# Patient Record
Sex: Male | Born: 1980 | Hispanic: No | Smoking: Current every day smoker
Health system: Southern US, Community
[De-identification: ages and names within clinical notes are randomized; demographics above are authoritative.]

## PROBLEM LIST (undated history)

## (undated) DIAGNOSIS — F1910 Other psychoactive substance abuse, uncomplicated: Secondary | ICD-10-CM

## (undated) DIAGNOSIS — B20 Human immunodeficiency virus [HIV] disease: Secondary | ICD-10-CM

## (undated) DIAGNOSIS — F319 Bipolar disorder, unspecified: Secondary | ICD-10-CM

## (undated) HISTORY — DX: Human immunodeficiency virus (HIV) disease: B20

---

## 2014-11-04 HISTORY — PX: ANKLE SURGERY: SHX546

## 2015-11-02 ENCOUNTER — Emergency Department (HOSPITAL_COMMUNITY)
Admission: EM | Admit: 2015-11-02 | Discharge: 2015-11-02 | Disposition: A | Discharge status: Home / Self Care | Payer: Medicare HMO | Attending: Emergency Medicine | Admitting: Emergency Medicine

## 2015-11-02 ENCOUNTER — Encounter (HOSPITAL_COMMUNITY): Payer: Self-pay | Admitting: Emergency Medicine

## 2015-11-02 DIAGNOSIS — F329 Major depressive disorder, single episode, unspecified: Secondary | ICD-10-CM | POA: Diagnosis present

## 2015-11-02 DIAGNOSIS — B20 Human immunodeficiency virus [HIV] disease: Secondary | ICD-10-CM | POA: Diagnosis not present

## 2015-11-02 DIAGNOSIS — F309 Manic episode, unspecified: Secondary | ICD-10-CM | POA: Diagnosis not present

## 2015-11-02 DIAGNOSIS — F1410 Cocaine abuse, uncomplicated: Secondary | ICD-10-CM | POA: Insufficient documentation

## 2015-11-02 DIAGNOSIS — F3010 Manic episode without psychotic symptoms, unspecified: Secondary | ICD-10-CM

## 2015-11-02 DIAGNOSIS — F17210 Nicotine dependence, cigarettes, uncomplicated: Secondary | ICD-10-CM | POA: Diagnosis not present

## 2015-11-02 DIAGNOSIS — F1310 Sedative, hypnotic or anxiolytic abuse, uncomplicated: Secondary | ICD-10-CM | POA: Diagnosis not present

## 2015-11-02 DIAGNOSIS — F1510 Other stimulant abuse, uncomplicated: Secondary | ICD-10-CM | POA: Diagnosis not present

## 2015-11-02 DIAGNOSIS — F1210 Cannabis abuse, uncomplicated: Secondary | ICD-10-CM | POA: Insufficient documentation

## 2015-11-02 DIAGNOSIS — Z79899 Other long term (current) drug therapy: Secondary | ICD-10-CM | POA: Insufficient documentation

## 2015-11-02 HISTORY — DX: Human immunodeficiency virus (HIV) disease: B20

## 2015-11-02 LAB — RAPID URINE DRUG SCREEN, HOSP PERFORMED
Amphetamines: POSITIVE — AB
BARBITURATES: NOT DETECTED
Benzodiazepines: POSITIVE — AB
Cocaine: POSITIVE — AB
OPIATES: NOT DETECTED
Tetrahydrocannabinol: POSITIVE — AB

## 2015-11-02 LAB — COMPREHENSIVE METABOLIC PANEL
ALBUMIN: 4 g/dL (ref 3.5–5.0)
ALT: 23 U/L (ref 17–63)
ANION GAP: 10 (ref 5–15)
AST: 24 U/L (ref 15–41)
Alkaline Phosphatase: 69 U/L (ref 38–126)
BUN: 11 mg/dL (ref 6–20)
CHLORIDE: 106 mmol/L (ref 101–111)
CO2: 26 mmol/L (ref 22–32)
Calcium: 8.8 mg/dL — ABNORMAL LOW (ref 8.9–10.3)
Creatinine, Ser: 0.88 mg/dL (ref 0.61–1.24)
GFR calc Af Amer: >60 mL/min (ref >60)
Glucose, Bld: 103 mg/dL — ABNORMAL HIGH (ref 65–99)
POTASSIUM: 4.1 mmol/L (ref 3.5–5.1)
Sodium: 142 mmol/L (ref 135–145)
Total Bilirubin: 0.3 mg/dL (ref 0.3–1.2)
Total Protein: 7.1 g/dL (ref 6.5–8.1)

## 2015-11-02 LAB — CBC
HCT: 45.5 % (ref 39.0–52.0)
Hemoglobin: 16.1 g/dL (ref 13.0–17.0)
MCH: 33.7 pg (ref 26.0–34.0)
MCHC: 35.4 g/dL (ref 30.0–36.0)
MCV: 95.2 fL (ref 78.0–100.0)
PLATELETS: 330 10*3/uL (ref 150–400)
RBC: 4.78 MIL/uL (ref 4.22–5.81)
RDW: 13.4 % (ref 11.5–15.5)
WBC: 7.6 10*3/uL (ref 4.0–10.5)

## 2015-11-02 LAB — SALICYLATE LEVEL: Salicylate Lvl: <4 mg/dL (ref 2.8–30.0)

## 2015-11-02 LAB — ETHANOL

## 2015-11-02 LAB — ACETAMINOPHEN LEVEL

## 2015-11-02 NOTE — ED Notes (Addendum)
Please disregard note. Error in entry.

## 2015-11-02 NOTE — ED Provider Notes (Signed)
CSN: [id number]     Arrival date & time 11/02/15  4098 History   First MD Initiated Contact with Patient 11/02/15 0740     Chief Complaint  Patient presents with  . Depression  . Mental Health Problem      HPI Patient reports episodes of depression, paranoia, angry outbursts, and what he believes to be mania over the past 2 weeks, worsening over the past week. Endorses thoughts of self harm but denies plan or intent, states that he is too cowardly to commit suicide and would never do it, states if he left hospital he would not be in danger of harming self. Denies HI/AVH. Describes "manic episodes" as periods of paranoia against his spouse, uncharacteristic angry outbursts where he broke lightbulbs in his house, and 2 episodes of unsafe behavior in which he drove excessively fast and continued accelerating the vehicle despite passengers fearfully asking him to stop. States this behavior is not at all consistent with his normal self. States he has been using cocaine frequently while partying with his friends and that his husband states pt's episodes correlate positively with cocaine use followed by not sleeping. No cocaine use today. Stressors include Mother and Grandmother dying in June 2016. Recent marriage. History of unspecified "behavior disorder" in 2006 which was resolved, cocaine and crystal meth use, HIV with CD4 levels below detectable range. Family history of bipolar disorder. Past Medical History  Diagnosis Date  . HIV (human immunodeficiency virus infection) [facility])    Past Surgical History  Procedure Laterality Date  . Ankle surgery  11/2014   History reviewed. No pertinent family history. Social History  Substance Use Topics  . Smoking status: Current Every Day Smoker -- 1.00 packs/day    Types: Cigarettes  . Smokeless tobacco: None  . Alcohol Use: Yes     Comment: occasionally    Review of Systems  All other systems reviewed and are negative.     Allergies  Review of  patient's allergies indicates no known allergies.  Home Medications   Prior to Admission medications   Medication Sig Start Date End Date Taking? Authorizing Provider  efavirenz-emtricitabine-tenofovir (ATRIPLA) 600-200-300 MG tablet Take 1 tablet by mouth at bedtime.   Yes Historical Provider, MD   BP 134/88 mmHg  Pulse 102  Temp(Src) 98.5 F (36.9 C) (Oral)  Resp 18  SpO2 100% Physical Exam  Constitutional: He is oriented to person, place, and time. He appears well-developed and well-nourished. No distress.  HENT:  Head: Normocephalic and atraumatic.  Eyes: Pupils are equal, round, and reactive to light.  Neck: Normal range of motion.  Cardiovascular: Normal rate and intact distal pulses.   Pulmonary/Chest: No respiratory distress.  Abdominal: Normal appearance. He exhibits no distension.  Musculoskeletal: Normal range of motion.  Neurological: He is alert and oriented to person, place, and time. No cranial nerve deficit.  Skin: Skin is warm and dry. No rash noted.  Psychiatric: He has a normal mood and affect. His behavior is normal. His speech is rapid and/or pressured. He expresses no homicidal and no suicidal ideation.  Nursing note and vitals reviewed.   ED Course  Procedures (including critical care time) Labs Review Labs Reviewed  COMPREHENSIVE METABOLIC PANEL - Abnormal; Notable for the following:    Glucose, Bld 103 (*)    Calcium 8.8 (*)    All other components within normal limits  ACETAMINOPHEN LEVEL - Abnormal; Notable for the following:    Acetaminophen (Tylenol), Serum <10 (*)    All  other components within normal limits  URINE RAPID DRUG SCREEN, HOSP PERFORMED - Abnormal; Notable for the following:    Cocaine POSITIVE (*)    Benzodiazepines POSITIVE (*)    Amphetamines POSITIVE (*)    Tetrahydrocannabinol POSITIVE (*)    All other components within normal limits  ETHANOL  SALICYLATE LEVEL  CBC    The patient was seen and evaluated by TTS.  Patient  was offered psychiatric counseling.  Did not want to be admitted and stated that he would find his own psychiatrist.  Patient is not acutely psychotic or suicidal.  At this time patient does not meet criteria for involuntary commitment.  We'll discharge with follow-up.    MDM   Final diagnoses:  Manic behavior (HCC)        Nelva Nayobert Danie Hannig, MD 11/02/15 1023

## 2015-11-02 NOTE — ED Notes (Signed)
Patient not in department when RN searched for pt for discharge. No discharge vitals or discharge signature obtained due to this. Pt had been screened and discharged.

## 2015-11-02 NOTE — ED Notes (Signed)
Patient reports episodes of depression, paranoia, angry outbursts, and what he believes to be mania over the past 2 weeks, worsening over the past week. Endorses thoughts of self harm but denies plan or intent, states that he is too cowardly to commit suicide and would never do it, states if he left hospital he would not be in danger of harming self. Denies HI/AVH. Describes "manic episodes" as periods of paranoia against his spouse, uncharacteristic angry outbursts where he broke lightbulbs in his house, and 2 episodes of unsafe behavior in which he drove excessively fast and continued accelerating the vehicle despite passengers fearfully asking him to stop. States this behavior is not at all consistent with his normal self. States he has been using cocaine frequently while partying with his friends and that his husband states pt's episodes correlate positively with cocaine use followed by not sleeping. No cocaine use today. Stressors include Mother and Grandmother dying in June 2016. Recent marriage. History of unspecified "behavior disorder" in 2006 which was resolved, cocaine and crystal meth use, HIV with CD4 levels below detectable range. Family history of bipolar disorder.

## 2015-11-02 NOTE — BH Assessment (Addendum)
Assessment Note  Jeffrey Mora is an 34 y.o.  male [facility] someone about his self diagnosed Bipolar Disorder. Sts that he took a "Bipolar Quiz" on the Internet that told him he was Bipolar. Patient reports recently feeling overwhelmed, paranoid, depressed, and suicidal without plan/intent. Onset of symptoms started after the death of his grandmother 5-6 months ago. Although, patient reports suicidal thoughts "on/off" he is able to contract for safety today. Sts, "I would never harm myself". He has no history of suicidal attempts/gestures. No self mutilating behaviors. Patient denies HI. He is currently cooperative. However, patient does appear to have manic symptoms evidenced by pressured speech and flight of ideas. No AVH's. However, patient reports paranoid thoughts occasionally. He reports being intimate with his spouse. Sts that the phone was near them during the intimate moment. Patient sts, "I couldn't stop thinking that I was being recorded". Patient also speaks of driving fast due to increased irritation. Sts that he is easily angered and just last week threw a object at the wall. Patient also finds himself crying uncontrollably. He has a family history of Bipolar Disorder (sister). No history of abuse.   Patient reports drug use. See Additional Social History section for details related to patient's substance use.   Patient has no history of inpatient treatment. He also has no history of outpatient services with the exception of a 1x session of grief therapy. Sts that the therapy was for the death of his grandmother. Patient is not taking any psychotropic medications.   Diagnosis: Bipolar, Manic Episode  Past Medical History:  Past Medical History  Diagnosis Date  . [facility]     Past Surgical History  Procedure Laterality Date  . Ankle surgery  11/2014    Family History: History reviewed. No pertinent family history.  Social  History:  reports that he has been smoking Cigarettes.  He has been smoking about 1.00 pack per day. He does not have any smokeless tobacco history on file. He reports that he drinks alcohol. He reports that he uses illicit drugs (Cocaine).  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" 1 - Age of First Use: 34 yrs old  1 - Amount (size/oz): up to 3 grams per use 1 - Frequency: unk 1 - Duration: unk 1 - Last Use / Amount: October 2016/ 3grams per week  Substance #2 Name of Substance 2: Cocaine 2 - Age of First Use: 34 yrs old  2 - Amount (size/oz): 2-7 grams per episode 2 - Frequency: 2x/month  2 - Duration: on-going  2 - Last Use / Amount: Christmas Day 2016; 2-3 grams  Substance #3 Name of Substance 3: THC 3 - Age of First Use: 14-15 yrs old  3 - Amount (size/oz): "I use very lightly" 3 - Frequency: "rarely use" 3 - Duration: on-going  3 - Last Use / Amount: "This morning"; "I took 1 puff"  CIWA: CIWA-Ar BP: 134/88 mmHg Pulse Rate: 102 COWS:    Allergies: No Known Allergies  Home Medications:  (Not in a hospital admission)  OB/GYN Status:  No LMP for male patient.  General Assessment Data Location of Assessment: [facility] ED TTS Assessment: In system Is this a Tele or Face-to-Face Assessment?: Face-to-Face Is this an Initial Assessment or a Re-assessment for this encounter?: Initial Assessment Marital status: Married  Maiden name:  (Married ) Is patient pregnant?: No Pregnancy Status: No Living Arrangements: Other (Comment), Spouse/significant other Can pt return to current living arrangement?: Yes Admission Status: Voluntary Is patient capable of signing voluntary admission?: Yes Referral Source: Self/Family/Friend Insurance type:  Administrator Medicare)     Crisis Care Plan Living Arrangements: Other (Comment), Spouse/significant  other Legal Guardian:  (patient denies ) Name of Psychiatrist:  (No psychiatrist ) Name of Therapist:  (No therapist )     Risk to self with the past 6 months Is patient at risk for suicide?: No, but patient needs Medical Clearance Family Suicide History:  (Mother-Bipolar ) Recent stressful life event(s): Other (Comment) ("I think I'm Bipolar..took a online test...it said I'm Bipol) Depression: Yes Depression Symptoms: Tearfulness Substance abuse history and/or treatment for substance abuse?: No Suicide prevention information given to non-admitted patients: Not applicable  Risk to Others within the past 6 months Violent Behavior Description:  (Patient currently calm and cooperative )  Psychosis Delusions: Unspecified ("I have delusional paranoid thoughts in my head")        ADLScreening [facility] Assessment Services) Patient's cognitive ability adequate to safely complete daily activities?: Yes Patient able to express need for assistance with ADLs?: Yes Independently performs ADLs?: Yes (appropriate for developmental age)        ADL Screening (condition at time of admission) Patient's cognitive ability adequate to safely complete daily activities?: Yes Is the patient deaf or have difficulty hearing?: No Does the patient have difficulty seeing, even when wearing glasses/contacts?: No Does the patient have difficulty concentrating, remembering, or making decisions?: No Patient able to express need for assistance with ADLs?: Yes Does the patient have difficulty dressing or bathing?: No Independently performs ADLs?: Yes (appropriate for developmental age) Does the patient have difficulty walking or climbing stairs?: No Weakness of Legs: None Weakness of Arms/Hands: None  Home Assistive Devices/Equipment Home Assistive Devices/Equipment: None    Abuse/Neglect Assessment (Assessment to be complete while patient is alone) Physical Abuse: Denies Verbal Abuse: Denies Sexual  Abuse: Denies Exploitation of patient/patient's resources: Denies Self-Neglect: Denies Values / Beliefs Cultural Requests During Hospitalization: None Spiritual Requests During Hospitalization: None   Advance Directives (For Healthcare) Does patient have an advance directive?: No Would patient like information on creating an advanced directive?: No - patient declined information          Disposition:   Patient offered but refused the Observation unit. Patient also offered but refused Psych IOP. Patient stated that he would like to find a psychiatrist/therapist on his own. Writer suggested the [employer] counseling center. Patient is a Consulting civil engineer at [employer]. Patient refused this suggestion. Writer also encouraged patient to follow up with his insurance provider for a list of providers (therapist/psychiatrist) that could bill under his insurance. Patient did agree to do so.   Discussed clinicals with Dr. Radford Pax and he agreed to discharge patient home for him to follow up a therapist or psychiatrist. Patient is no committable and would like to go home asap.     On Site Evaluation by:   Reviewed with Physician:    Melynda Ripple [facility] 11/02/2015 10:30 AM

## 2015-11-02 NOTE — Discharge Instructions (Signed)
Bipolar Disorder °Bipolar disorder is a mental illness. The term bipolar disorder actually is used to describe a group of disorders that all share varying degrees of emotional highs and lows that can interfere with daily functioning, such as work, school, or relationships. Bipolar disorder also can lead to drug abuse, hospitalization, and suicide. °The emotional highs of bipolar disorder are periods of elation or irritability and high energy. These highs can range from a mild form (hypomania) to a severe form (mania). People experiencing episodes of hypomania may appear energetic, excitable, and highly productive. People experiencing mania may behave impulsively or erratically. They often make poor decisions. They may have difficulty sleeping. The most severe episodes of mania can involve having very distorted beliefs or perceptions about the world and seeing or hearing things that are not real (psychotic delusions and hallucinations).  °The emotional lows of bipolar disorder (depression) also can range from mild to severe. Severe episodes of bipolar depression can involve psychotic delusions and hallucinations. °Sometimes people with bipolar disorder experience a state of mixed mood. Symptoms of hypomania or mania and depression are both present during this mixed-mood episode. °SIGNS AND SYMPTOMS °There are signs and symptoms of the episodes of hypomania and mania as well as the episodes of depression. The signs and symptoms of hypomania and mania are similar but vary in severity. They include: °· Inflated self-esteem or feeling of increased self-confidence. °· Decreased need for sleep. °· Unusual talkativeness (rapid or pressured speech) or the feeling of a need to keep talking. °· Sensation of racing thoughts or constant talking, with quick shifts between topics that may or may not be related (flight of ideas). °· Decreased ability to focus or concentrate. °· Increased purposeful activity, such as work, studies,  or social activity, or nonproductive activity, such as pacing, squirming and fidgeting, or finger and toe tapping. °· Impulsive behavior and use of poor judgment, resulting in high-risk activities, such as having unprotected sex or spending excessive amounts of money. °Signs and symptoms of depression include the following:  °· Feelings of sadness, hopelessness, or helplessness. °· Frequent or uncontrollable episodes of crying. °· Lack of feeling anything or caring about anything. °· Difficulty sleeping or sleeping too much.  °· Inability to enjoy the things you used to enjoy.   °· Desire to be alone all the time.   °· Feelings of guilt or worthlessness.  °· Lack of energy or motivation.   °· Difficulty concentrating, remembering, or making decisions.  °· Change in appetite or weight beyond normal fluctuations. °· Thoughts of death or the desire to harm yourself. °DIAGNOSIS  °Bipolar disorder is diagnosed through an assessment by your caregiver. Your caregiver will ask questions about your emotional episodes. There are two main types of bipolar disorder. People with type I bipolar disorder have manic episodes with or without depressive episodes. People with type II bipolar disorder have hypomanic episodes and major depressive episodes, which are more serious than mild depression. The type of bipolar disorder you have can make an important difference in how your illness is monitored and treated. °Your caregiver may ask questions about your medical history and use of alcohol or drugs, including prescription medication. Certain medical conditions and substances also can cause emotional highs and lows that resemble bipolar disorder (secondary bipolar disorder).  °TREATMENT  °Bipolar disorder is a long-term illness. It is best controlled with continuous treatment rather than treatment only when symptoms occur. The following treatments can be prescribed for bipolar disorders: °· Medication--Medication can be prescribed by  a doctor that   is an expert in treating mental disorders (psychiatrists). Medications called mood stabilizers are usually prescribed to help control the illness. Other medications are sometimes added if symptoms of mania, depression, or psychotic delusions and hallucinations occur despite the use of a mood stabilizer. °· Talk therapy--Some forms of talk therapy are helpful in providing support, education, and guidance. °A combination of medication and talk therapy is best for managing the disorder over time. A procedure in which electricity is applied to your brain through your scalp (electroconvulsive therapy) is used in cases of severe mania when medication and talk therapy do not work or work too slowly. °  °This information is not intended to replace advice given to you by your health care provider. Make sure you discuss any questions you have with your health care provider. °  °Document Released: 01/27/2001 Document Revised: 11/11/2014 Document Reviewed: 11/16/2012 °Elsevier Interactive Patient Education ©2016 [employer] ° °

## 2015-11-04 ENCOUNTER — Emergency Department (HOSPITAL_COMMUNITY)
Admission: EM | Admit: 2015-11-04 | Discharge: 2015-11-05 | Disposition: A | Discharge status: Other Acute Inpatient Hospital | Payer: Medicare HMO | Attending: Emergency Medicine | Admitting: Emergency Medicine

## 2015-11-04 ENCOUNTER — Encounter (HOSPITAL_COMMUNITY): Payer: Self-pay | Admitting: Emergency Medicine

## 2015-11-04 DIAGNOSIS — F17210 Nicotine dependence, cigarettes, uncomplicated: Secondary | ICD-10-CM | POA: Insufficient documentation

## 2015-11-04 DIAGNOSIS — F1310 Sedative, hypnotic or anxiolytic abuse, uncomplicated: Secondary | ICD-10-CM | POA: Insufficient documentation

## 2015-11-04 DIAGNOSIS — Y998 Other external cause status: Secondary | ICD-10-CM | POA: Insufficient documentation

## 2015-11-04 DIAGNOSIS — Z79899 Other long term (current) drug therapy: Secondary | ICD-10-CM | POA: Insufficient documentation

## 2015-11-04 DIAGNOSIS — Y9289 Other specified places as the place of occurrence of the external cause: Secondary | ICD-10-CM | POA: Insufficient documentation

## 2015-11-04 DIAGNOSIS — T424X2A Poisoning by benzodiazepines, intentional self-harm, initial encounter: Secondary | ICD-10-CM | POA: Insufficient documentation

## 2015-11-04 DIAGNOSIS — F1210 Cannabis abuse, uncomplicated: Secondary | ICD-10-CM | POA: Insufficient documentation

## 2015-11-04 DIAGNOSIS — T50902A Poisoning by unspecified drugs, medicaments and biological substances, intentional self-harm, initial encounter: Secondary | ICD-10-CM

## 2015-11-04 DIAGNOSIS — F332 Major depressive disorder, recurrent severe without psychotic features: Secondary | ICD-10-CM

## 2015-11-04 DIAGNOSIS — B20 Human immunodeficiency virus [HIV] disease: Secondary | ICD-10-CM | POA: Insufficient documentation

## 2015-11-04 DIAGNOSIS — Y9389 Activity, other specified: Secondary | ICD-10-CM | POA: Insufficient documentation

## 2015-11-04 LAB — CBC
HCT: 46.8 % (ref 39.0–52.0)
Hemoglobin: 16.6 g/dL (ref 13.0–17.0)
MCH: 33.7 pg (ref 26.0–34.0)
MCHC: 35.5 g/dL (ref 30.0–36.0)
MCV: 95.1 fL (ref 78.0–100.0)
PLATELETS: 326 10*3/uL (ref 150–400)
RBC: 4.92 MIL/uL (ref 4.22–5.81)
RDW: 13.5 % (ref 11.5–15.5)
WBC: 12.6 10*3/uL — AB (ref 4.0–10.5)

## 2015-11-04 LAB — COMPREHENSIVE METABOLIC PANEL
ALT: 22 U/L (ref 17–63)
AST: 27 U/L (ref 15–41)
Albumin: 4.2 g/dL (ref 3.5–5.0)
Alkaline Phosphatase: 74 U/L (ref 38–126)
Anion gap: 9 (ref 5–15)
BUN: 14 mg/dL (ref 6–20)
CHLORIDE: 108 mmol/L (ref 101–111)
CO2: 23 mmol/L (ref 22–32)
Calcium: 9.2 mg/dL (ref 8.9–10.3)
Creatinine, Ser: 0.79 mg/dL (ref 0.61–1.24)
Glucose, Bld: 104 mg/dL — ABNORMAL HIGH (ref 65–99)
POTASSIUM: 4.3 mmol/L (ref 3.5–5.1)
SODIUM: 140 mmol/L (ref 135–145)
Total Bilirubin: 0.5 mg/dL (ref 0.3–1.2)
Total Protein: 7.2 g/dL (ref 6.5–8.1)

## 2015-11-04 LAB — ETHANOL

## 2015-11-04 LAB — SALICYLATE LEVEL

## 2015-11-04 LAB — ACETAMINOPHEN LEVEL

## 2015-11-04 MED ORDER — SODIUM CHLORIDE 0.9 % IV BOLUS (SEPSIS)
2000.0000 mL | Freq: Once | INTRAVENOUS | Status: AC
  Administered 2015-11-04: 2000 mL via INTRAVENOUS

## 2015-11-04 NOTE — ED Provider Notes (Signed)
CSN: [id number]     Arrival date & time 11/04/15  2258 History  By signing my name below, I, Phillis HaggisGabriella Gaje, attest that this documentation has been prepared under the direction and in the presence of Tanzie Rothschild, MD. Electronically Signed: Phillis HaggisGabriella Gaje, ED Scribe. 11/04/2015. 12:54 AM.  Chief Complaint  Patient presents with  . Drug Overdose   Patient is a 34 y.o. male presenting with Overdose. The history is provided by the patient. No language interpreter was used.  Drug Overdose This is a new problem. The current episode started less than 1 hour ago. The problem occurs constantly. The problem has been gradually worsening. He has tried nothing for the symptoms.  HPI Comments: Kenard Gowerony Rodino is a 34 y.o. Male with a hx of HIV who presents to the Emergency Department complaining of drug overdose onset one hour ago. Pt reports that he used marijuana earlier today and took about 30 mg of Xanax, ~22 mg in the last hour. Pt states that he was trying to "knock myself out so my husband could go party." He reports that "I wasn't necessarily trying to commit suicide." Pt reports drowsiness and fatigue. Per triage note, pt is trying to detox from cocaine which he last used one week ago. Pt was seen in the ED 11/02/15 for a suicide attempt. He denies fever, chills, nausea, vomiting, hallucinations or HI.     Past Medical History  Diagnosis Date  . HIV (human immunodeficiency virus infection) [facility](HCC)    Past Surgical History  Procedure Laterality Date  . Ankle surgery  11/2014   History reviewed. No pertinent family history. Social History  Substance Use Topics  . Smoking status: Current Every Day Smoker -- 1.00 packs/day    Types: Cigarettes  . Smokeless tobacco: None  . Alcohol Use: Yes     Comment: occasionally    Review of Systems  Constitutional: Positive for fatigue. Negative for fever and chills.  Gastrointestinal: Negative for nausea and vomiting.  Psychiatric/Behavioral: Positive for  suicidal ideas. Negative for hallucinations.  All other systems reviewed and are negative.  Allergies  Review of patient's allergies indicates no known allergies.  Home Medications   Prior to Admission medications   Medication Sig Start Date End Date Taking? Authorizing Provider  efavirenz-emtricitabine-tenofovir (ATRIPLA) 600-200-300 MG tablet Take 1 tablet by mouth at bedtime.    Historical Provider, MD   BP 127/80 mmHg  Pulse 114  Temp(Src) 98.2 F (36.8 C) (Oral)  Resp 24  SpO2 95% Physical Exam  Constitutional: He is oriented to person, place, and time. He appears well-developed and well-nourished. No distress.  Smelled of marijuana  HENT:  Head: Normocephalic and atraumatic.  Mouth/Throat: Oropharynx is clear and moist. No oropharyngeal exudate.  Trachea midline  Eyes: Conjunctivae and EOM are normal. Pupils are equal, round, and reactive to light.  Neck: Trachea normal and normal range of motion. Neck supple. No JVD present. Carotid bruit is not present.  Cardiovascular: Normal rate and regular rhythm.  Exam reveals no gallop and no friction rub.   No murmur heard. Pulmonary/Chest: Effort normal and breath sounds normal. No stridor. He has no wheezes. He has no rales.  Abdominal: Soft. Bowel sounds are normal. He exhibits no mass. There is no tenderness. There is no rebound and no guarding.  Musculoskeletal: Normal range of motion.  Lymphadenopathy:    He has no cervical adenopathy.  Neurological: He is alert and oriented to person, place, and time. He has normal reflexes. He displays normal  reflexes. No cranial nerve deficit. He exhibits normal muscle tone. Coordination normal.  Cranial nerves 2-12 intact  Skin: Skin is warm and dry. He is not diaphoretic.  Psychiatric: His behavior is normal. His affect is blunt. He expresses suicidal ideation.    ED Course  Procedures (including critical care time) DIAGNOSTIC STUDIES: Oxygen Saturation is 95% on RA, adequate by my  interpretation.    COORDINATION OF CARE: 11:17 PM-Discussed treatment plan which includes labs, IV and poison control call with pt at bedside and pt agreed to plan.    Labs Review Labs Reviewed  COMPREHENSIVE METABOLIC PANEL - Abnormal; Notable for the following:    Glucose, Bld 104 (*)    All other components within normal limits  ACETAMINOPHEN LEVEL - Abnormal; Notable for the following:    Acetaminophen (Tylenol), Serum <10 (*)    All other components within normal limits  CBC - Abnormal; Notable for the following:    WBC 12.6 (*)    All other components within normal limits  URINE RAPID DRUG SCREEN, HOSP PERFORMED - Abnormal; Notable for the following:    Benzodiazepines POSITIVE (*)    Tetrahydrocannabinol POSITIVE (*)    All other components within normal limits  I-STAT CHEM 8, ED - Abnormal; Notable for the following:    Glucose, Bld 108 (*)    Calcium, Ion 1.11 (*)    All other components within normal limits  ETHANOL  SALICYLATE LEVEL  CBG MONITORING, ED  I-STAT CG4 LACTIC ACID, ED  I-STAT CG4 LACTIC ACID, ED    Imaging Review No results found. I have personally reviewed and evaluated these images and lab results as part of my medical decision-making.   EKG Interpretation   Date/Time:  Saturday November 04 2015 23:13:54 EST Ventricular Rate:  112 PR Interval:  182 QRS Duration: 103 QT Interval:  331 QTC Calculation: 452 R Axis:   74 Text Interpretation:  Sinus tachycardia Confirmed by [facility]  MD,  Morene Antu ([id number]) on 11/04/2015 11:30:53 PM      MDM   Final diagnoses:  None    Will need admission to psych for suicide attempt with benzos  I personally performed the services described in this documentation, which was scribed in my presence. The recorded information has been reviewed and is accurate.      Cy Blamer, MD 11/05/15 [phone]

## 2015-11-04 NOTE — ED Notes (Signed)
Pt states that he has been having fights with his husband and took eight 2mg  Xanax bars approx. 45 mins ago. When he asked if he was trying to kill himself or sleep he stated "whatever happens happens." Feels drowsy. States he's trying to get off of cocaine. Last used 1 week ago. Alert and oriented.

## 2015-11-05 ENCOUNTER — Encounter (HOSPITAL_COMMUNITY): Payer: Self-pay

## 2015-11-05 ENCOUNTER — Inpatient Hospital Stay (HOSPITAL_COMMUNITY)
Admission: AD | Admit: 2015-11-05 | Discharge: 2015-11-08 | DRG: 885 | Disposition: A | Discharge status: Home / Self Care | Payer: Medicare HMO | Source: Intra-hospital | Attending: Psychiatry | Admitting: Psychiatry

## 2015-11-05 ENCOUNTER — Encounter (HOSPITAL_COMMUNITY): Payer: Self-pay | Admitting: Emergency Medicine

## 2015-11-05 DIAGNOSIS — T1491 Suicide attempt: Secondary | ICD-10-CM

## 2015-11-05 DIAGNOSIS — F17210 Nicotine dependence, cigarettes, uncomplicated: Secondary | ICD-10-CM | POA: Diagnosis present

## 2015-11-05 DIAGNOSIS — R45851 Suicidal ideations: Secondary | ICD-10-CM | POA: Diagnosis present

## 2015-11-05 DIAGNOSIS — F3181 Bipolar II disorder: Principal | ICD-10-CM | POA: Diagnosis present

## 2015-11-05 DIAGNOSIS — B20 Human immunodeficiency virus [HIV] disease: Secondary | ICD-10-CM | POA: Diagnosis present

## 2015-11-05 DIAGNOSIS — T424X2A Poisoning by benzodiazepines, intentional self-harm, initial encounter: Secondary | ICD-10-CM | POA: Diagnosis not present

## 2015-11-05 DIAGNOSIS — F1910 Other psychoactive substance abuse, uncomplicated: Secondary | ICD-10-CM | POA: Diagnosis present

## 2015-11-05 DIAGNOSIS — T50902A Poisoning by unspecified drugs, medicaments and biological substances, intentional self-harm, initial encounter: Secondary | ICD-10-CM | POA: Insufficient documentation

## 2015-11-05 DIAGNOSIS — F319 Bipolar disorder, unspecified: Secondary | ICD-10-CM | POA: Diagnosis present

## 2015-11-05 DIAGNOSIS — Z915 Personal history of self-harm: Secondary | ICD-10-CM | POA: Diagnosis not present

## 2015-11-05 DIAGNOSIS — G4700 Insomnia, unspecified: Secondary | ICD-10-CM | POA: Diagnosis present

## 2015-11-05 DIAGNOSIS — F332 Major depressive disorder, recurrent severe without psychotic features: Secondary | ICD-10-CM | POA: Diagnosis not present

## 2015-11-05 DIAGNOSIS — F1410 Cocaine abuse, uncomplicated: Secondary | ICD-10-CM | POA: Diagnosis present

## 2015-11-05 DIAGNOSIS — Z818 Family history of other mental and behavioral disorders: Secondary | ICD-10-CM | POA: Diagnosis not present

## 2015-11-05 LAB — RAPID URINE DRUG SCREEN, HOSP PERFORMED
Amphetamines: NOT DETECTED
BARBITURATES: NOT DETECTED
BENZODIAZEPINES: POSITIVE — AB
COCAINE: NOT DETECTED
Opiates: NOT DETECTED
TETRAHYDROCANNABINOL: POSITIVE — AB

## 2015-11-05 LAB — I-STAT CG4 LACTIC ACID, ED
LACTIC ACID, VENOUS: 0.68 mmol/L (ref 0.5–2.0)
Lactic Acid, Venous: 1.52 mmol/L (ref 0.5–2.0)

## 2015-11-05 LAB — I-STAT CHEM 8, ED
BUN: 16 mg/dL (ref 6–20)
CREATININE: 0.7 mg/dL (ref 0.61–1.24)
Calcium, Ion: 1.11 mmol/L — ABNORMAL LOW (ref 1.12–1.23)
Chloride: 111 mmol/L (ref 101–111)
GLUCOSE: 108 mg/dL — AB (ref 65–99)
HCT: 43 % (ref 39.0–52.0)
HEMOGLOBIN: 14.6 g/dL (ref 13.0–17.0)
Potassium: 4.2 mmol/L (ref 3.5–5.1)
Sodium: 144 mmol/L (ref 135–145)
TCO2: 25 mmol/L (ref 0–100)

## 2015-11-05 LAB — CBG MONITORING, ED: Glucose-Capillary: 99 mg/dL (ref 65–99)

## 2015-11-05 MED ORDER — ZIPRASIDONE MESYLATE 20 MG IM SOLR
20.0000 mg | Freq: Once | INTRAMUSCULAR | Status: AC
  Administered 2015-11-05: 20 mg via INTRAMUSCULAR

## 2015-11-05 MED ORDER — DIPHENHYDRAMINE HCL 50 MG/ML IJ SOLN
50.0000 mg | Freq: Once | INTRAMUSCULAR | Status: AC
  Administered 2015-11-05: 50 mg via INTRAMUSCULAR
  Filled 2015-11-05: qty 1

## 2015-11-05 MED ORDER — EFAVIRENZ-EMTRICITAB-TENOFOVIR 600-200-300 MG PO TABS
1.0000 | ORAL_TABLET | Freq: Every day | ORAL | Status: DC
  Administered 2015-11-06 – 2015-11-07 (×2): 1 via ORAL
  Filled 2015-11-05 (×4): qty 1

## 2015-11-05 MED ORDER — ALUM & MAG HYDROXIDE-SIMETH 200-200-20 MG/5ML PO SUSP: 30.0000 mL | ORAL | Status: DC | PRN

## 2015-11-05 MED ORDER — MAGNESIUM HYDROXIDE 400 MG/5ML PO SUSP: 30.0000 mL | Freq: Every day | ORAL | Status: DC | PRN

## 2015-11-05 MED ORDER — IBUPROFEN 200 MG PO TABS: 600.0000 mg | ORAL_TABLET | Freq: Three times a day (TID) | ORAL | Status: DC | PRN

## 2015-11-05 MED ORDER — ACETAMINOPHEN 325 MG PO TABS: 650.0000 mg | ORAL_TABLET | ORAL | Status: DC | PRN

## 2015-11-05 MED ORDER — NICOTINE 21 MG/24HR TD PT24
21.0000 mg | MEDICATED_PATCH | Freq: Once | TRANSDERMAL | Status: AC
  Administered 2015-11-05 (×2): 21 mg via TRANSDERMAL
  Filled 2015-11-05 (×2): qty 1

## 2015-11-05 MED ORDER — HYDROXYZINE HCL 25 MG PO TABS
25.0000 mg | ORAL_TABLET | Freq: Three times a day (TID) | ORAL | Status: DC | PRN
  Administered 2015-11-06 – 2015-11-07 (×2): 25 mg via ORAL
  Filled 2015-11-05 (×2): qty 1

## 2015-11-05 MED ORDER — TRAZODONE HCL 50 MG PO TABS
50.0000 mg | ORAL_TABLET | Freq: Every evening | ORAL | Status: DC | PRN
  Administered 2015-11-06 – 2015-11-07 (×2): 50 mg via ORAL
  Filled 2015-11-05 (×2): qty 1

## 2015-11-05 MED ORDER — ZIPRASIDONE MESYLATE 20 MG IM SOLR: 20.0000 mg | Freq: Once | INTRAMUSCULAR | Status: DC

## 2015-11-05 MED ORDER — ONDANSETRON HCL 4 MG PO TABS: 4.0000 mg | ORAL_TABLET | Freq: Three times a day (TID) | ORAL | Status: DC | PRN

## 2015-11-05 MED ORDER — LORAZEPAM 2 MG/ML IJ SOLN
2.0000 mg | Freq: Once | INTRAMUSCULAR | Status: AC
  Administered 2015-11-05: 2 mg via INTRAMUSCULAR
  Filled 2015-11-05: qty 1

## 2015-11-05 MED ORDER — ZIPRASIDONE MESYLATE 20 MG IM SOLR
10.0000 mg | Freq: Once | INTRAMUSCULAR | Status: DC
  Filled 2015-11-05: qty 20

## 2015-11-05 MED ORDER — HYDROXYZINE HCL 25 MG PO TABS
50.0000 mg | ORAL_TABLET | Freq: Once | ORAL | Status: AC
  Administered 2015-11-05: 50 mg via ORAL
  Filled 2015-11-05: qty 2

## 2015-11-05 MED ORDER — EFAVIRENZ-EMTRICITAB-TENOFOVIR 600-200-300 MG PO TABS
1.0000 | ORAL_TABLET | Freq: Every day | ORAL | Status: DC
  Administered 2015-11-05: 1 via ORAL
  Filled 2015-11-05: qty 1

## 2015-11-05 MED ORDER — LORAZEPAM 1 MG PO TABS: 1.0000 mg | ORAL_TABLET | Freq: Three times a day (TID) | ORAL | Status: DC | PRN

## 2015-11-05 MED ORDER — ACETAMINOPHEN 325 MG PO TABS: 650.0000 mg | ORAL_TABLET | Freq: Four times a day (QID) | ORAL | Status: DC | PRN

## 2015-11-05 NOTE — ED Notes (Signed)
Pt. C/o some anxiety. 

## 2015-11-05 NOTE — ED Notes (Signed)
Maryjean Mornharles Kober NP consulted concerning patients behavior and treatment options.

## 2015-11-05 NOTE — ED Notes (Signed)
Pt. Noted sleeping in room. No complaints or concerns voiced. No distress or abnormal behavior noted. Will continue to monitor with security cameras. Q 15 minute rounds continue. 

## 2015-11-05 NOTE — BH Assessment (Signed)
Tele Assessment Note   Jeffrey Mora is an 35 y.o. male presenting to [facility] after an intentional overdose. Pt stated "I took some pills". Pt reported that he took the pills because he was fighting with his ex. Pt did not report any previous suicide attempts or self-injurious behaviors. Pt denies HI and AVH at this time. Pt did not report any psychiatric hospitalizations or mental health treatment. Pt is endorsing multiple depressive symptoms and shared that fighting with his ex is a stressor. Pt reported that his sleep and appetite have been poor. Pt reported that he uses THC and cocaine "from time to time". Pt did not report any physical, sexual or emotional abuse.  Pt is drowsy and maintains poor eye contact throughout this assessment. Pt speech is soft and thought process is coherent and relevant. Pt judgment and insight is poor. Pt mood is depressed and affect is congruent with mood.  Inpatient treatment is recommended for psychiatric stabilization.    Diagnosis: Major Depressive Disorder, Recurrent episode, Severe.   Past Medical History:  Past Medical History  Diagnosis Date  . HIV (human immunodeficiency virus infection) [facility])     Past Surgical History  Procedure Laterality Date  . Ankle surgery  11/2014    Family History: History reviewed. No pertinent family history.  Social History:  reports that he has been smoking Cigarettes.  He has been smoking about 1.00 pack per day. He does not have any smokeless tobacco history on file. He reports that he drinks alcohol. He reports that he uses illicit drugs (Cocaine).  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" 1 - Age of First Use: 35 yrs old  1 - Amount (size/oz): up to 3 grams per use 1 - Frequency: unk 1 - Duration: unk 1 - Last Use / Amount: October 2016/ 3grams per week  Substance #2 Name of Substance 2: Cocaine 2 - Age of First Use:  35 yrs old  2 - Amount (size/oz): 2-7 grams per episode 2 - Frequency: 2x/month  2 - Duration: on-going  2 - Last Use / Amount: Christmas Day 2016; 2-3 grams  Substance #3 Name of Substance 3: THC 3 - Age of First Use: 14-15 yrs old  3 - Amount (size/oz): "I use very lightly" 3 - Frequency: "rarely use" 3 - Duration: on-going  3 - Last Use / Amount: 11-02-15  CIWA: CIWA-Ar BP: 109/73 mmHg Pulse Rate: 87 COWS:    PATIENT STRENGTHS: (choose at least two) Average or above average intelligence Communication skills  Allergies: No Known Allergies  Home Medications:  (Not in a hospital admission)  OB/GYN Status:  No LMP for male patient.  General Assessment Data Location of Assessment: [facility] TTS Assessment: In system Is this a Tele or Face-to-Face Assessment?: Face-to-Face Is this an Initial Assessment or a Re-assessment for this encounter?: Initial Assessment Marital status: Married Living Arrangements: Spouse/significant other Can pt return to current living arrangement?: Yes Admission Status: Voluntary Is patient capable of signing voluntary admission?: Yes Referral Source: Self/Family/Friend Insurance type: AETNA Medicare      Crisis Care Plan Living Arrangements: Spouse/significant other Name of Psychiatrist: No provider reported  Name of Therapist: No provider reported.   Education Status Is patient currently in school?: Yes Current Grade: N/A Highest grade of school patient has completed: 12 Name of school: [employer]  Contact person: N/A  Risk to self with the  past 6 months Suicidal Ideation: Yes-Currently Present Has patient been a risk to self within the past 6 months prior to admission? : Yes Suicidal Intent: Yes-Currently Present Has patient had any suicidal intent within the past 6 months prior to admission? : Yes Is patient at risk for suicide?: Yes Suicidal Plan?: Yes-Currently Present Has patient had any suicidal plan within the past 6 months prior to  admission? : No Specify Current Suicidal Plan: Pt reported that he took 10-12 Xanax.  Access to Means: Yes Specify Access to Suicidal Means: Access to Xanax. What has been your use of drugs/alcohol within the last 12 months?: Cocaine and THC use.  Previous Attempts/Gestures: No How many times?: 0 Other Self Harm Risks: None Triggers for Past Attempts: None known Intentional Self Injurious Behavior: None Family Suicide History: No (Mother-Bipolar ) Recent stressful life event(s): Other (Comment) (Fight with ex. ) Persecutory voices/beliefs?: No Depression: Yes Depression Symptoms: Despondent, Feeling angry/irritable, Feeling worthless/self pity, Insomnia Substance abuse history and/or treatment for substance abuse?: Yes Suicide prevention information given to non-admitted patients: Not applicable  Risk to Others within the past 6 months Homicidal Ideation: No Does patient have any lifetime risk of violence toward others beyond the six months prior to admission? : No Thoughts of Harm to Others: No Current Homicidal Intent: No Current Homicidal Plan: No Access to Homicidal Means: No Identified Victim: N/A History of harm to others?: No Assessment of Violence: None Noted Violent Behavior Description: No violent behaviors observed. Pt is calm and cooperative at this time.  Does patient have access to weapons?: No Criminal Charges Pending?: No Does patient have a court date: No Is patient on probation?: No  Psychosis Hallucinations: None noted Delusions: None noted  Mental Status Report Appearance/Hygiene: In hospital gown Eye Contact: Poor Motor Activity: Unremarkable Speech: Logical/coherent Level of Consciousness: Quiet/awake Mood: Depressed Affect: Depressed Anxiety Level: Minimal Panic attack frequency: 2x weekly; every other week  Most recent panic attack: 10-30-15 Thought Processes: Relevant, Coherent Judgement: Unimpaired Orientation: Person, Place, Time,  Situation Obsessive Compulsive Thoughts/Behaviors: None  Cognitive Functioning Concentration: Decreased Memory: Recent Intact, Remote Intact IQ: Average Insight: Poor Impulse Control: Poor Appetite: Poor Weight Loss: 20 ("over 1 week" ) Weight Gain: 0 Sleep: Decreased Total Hours of Sleep: 4 Vegetative Symptoms: Staying in bed, Decreased grooming  ADLScreening [facility] Assessment Services) Patient's cognitive ability adequate to safely complete daily activities?: Yes Patient able to express need for assistance with ADLs?: Yes Independently performs ADLs?: Yes (appropriate for developmental age)  Prior Inpatient Therapy Prior Inpatient Therapy: No  Prior Outpatient Therapy Prior Outpatient Therapy: No Does patient have an ACCT team?: No Does patient have Intensive In-House Services?  : No Does patient have [facility] services? : No Does patient have P4CC services?: No  ADL Screening (condition at time of admission) Patient's cognitive ability adequate to safely complete daily activities?: Yes Is the patient deaf or have difficulty hearing?: No Does the patient have difficulty seeing, even when wearing glasses/contacts?: No Does the patient have difficulty concentrating, remembering, or making decisions?: No Patient able to express need for assistance with ADLs?: Yes Does the patient have difficulty dressing or bathing?: No Independently performs ADLs?: Yes (appropriate for developmental age)       Abuse/Neglect Assessment (Assessment to be complete while patient is alone) Physical Abuse: Denies Verbal Abuse: Denies Sexual Abuse: Denies Exploitation of patient/patient's resources: Denies Self-Neglect: Denies     Merchant navy officerAdvance Directives (For Healthcare) Does patient have an advance directive?: No    Additional  Information 1:1 In Past 12 Months?: Yes CIRT Risk: No Elopement Risk: No Does patient have medical clearance?: No     Disposition:  Disposition Initial  Assessment Completed for this Encounter: Yes Disposition of Patient: Inpatient treatment program Type of inpatient treatment program: Adult  Jamarii Banks S 11/05/2015 5:36 AM

## 2015-11-05 NOTE — Progress Notes (Signed)
Disposition CSW completed patient referrals to the following inpatient psych facilities:  [facility]  CSW will continue to follow patient for placement needs.  Seward SpeckLeo Riva Sesma [facility] Ass[facility]CSW,LCAS Behavioral Health Disposition CSW [phone]

## 2015-11-05 NOTE — ED Notes (Signed)
Pt. To [facility] from ED ambulatory without difficulty, to room 38 . [facility]. Pt. Is alert and oriented, warm and dry in no acute distress. Pt. Denies SI, HI, and AVH. Pt. Appears manic and irritated about being in [facility]. Pt. States he "needs something for sleep". Pt. Made aware of security cameras and Q15 minute rounds. Pt. Encouraged to let Nursing staff know of any concerns or needs.

## 2015-11-05 NOTE — Progress Notes (Signed)
Report received from [place], [place]. Pt resting in bed with no s/s of distress noted at this time. Pt with no complaints or concerns.

## 2015-11-05 NOTE — Progress Notes (Signed)
[facility] Rosey BathKelly Kaida Games, RN

## 2015-11-05 NOTE — ED Notes (Signed)
POISON CONTROL called----- report and updates on pt's condition provided.

## 2015-11-05 NOTE — ED Notes (Signed)
Explained POC to patient and support offered.  Explained IVC process. Toileting items given and food rewarmed.

## 2015-11-05 NOTE — ED Notes (Signed)
Up to the bathroom 

## 2015-11-05 NOTE — Consult Note (Signed)
[facility] Consult   Reason for Consult:  Suicide attempt, drug OD Referring Physician:  EDP Patient Identification: Jeffrey Mora MRN:  [id number] Principal Diagnosis: Major depressive disorder, recurrent, severe without psychotic features [facility]) Diagnosis:   Patient Active Problem List   Diagnosis Date Noted  . Major depressive disorder, recurrent, severe without psychotic features ([place]) [F33.2] 11/05/2015    Priority: High    Total Time spent with patient: 45 minutes  Subjective:   Jeffrey Mora is a 35 y.o. male patient admitted with suicide attempt, Drug OD.  HPI:  Caucasian male, 35 years old was evaluated after he ingested 30 mg of Xanax last night.  Patient was sleeping during this interview and minimally participated.  He was given Geodon injection when he attempted to leave the unit.  Patient was able to say that he buys Xanax off the street.  Patient reported that he drinks Alcohol occasionally.  Patient reported that he is married but not happy.  Patient was sleeping off in between sentences.  Patient has been accepted for admission and we will be seeking placement at any facility with available bed.   Past Psychiatric History: Depression  Risk to Self: Suicidal Ideation: Yes-Currently Present Suicidal Intent: Yes-Currently Present Is patient at risk for suicide?: Yes Suicidal Plan?: Yes-Currently Present Specify Current Suicidal Plan: Pt reported that he took 10-12 Xanax.  Access to Means: Yes Specify Access to Suicidal Means: Access to Xanax. What has been your use of drugs/alcohol within the last 12 months?: Cocaine and THC use.  How many times?: 0 Other Self Harm Risks: None Triggers for Past Attempts: None known Intentional Self Injurious Behavior: None Risk to Others: Homicidal Ideation: No Thoughts of Harm to Others: No Current Homicidal Intent: No Current Homicidal Plan: No Access to Homicidal Means: No Identified Victim: N/A History of harm to  others?: No Assessment of Violence: None Noted Violent Behavior Description: No violent behaviors observed. Pt is calm and cooperative at this time.  Does patient have access to weapons?: No Criminal Charges Pending?: No Does patient have a court date: No Prior Inpatient Therapy: Prior Inpatient Therapy: No Prior Outpatient Therapy: Prior Outpatient Therapy: No Does patient have an ACCT team?: No Does patient have Intensive In-House Services?  : No Does patient have [facility] services? : No Does patient have P4CC services?: No  Past Medical History:  Past Medical History  Diagnosis Date  . HIV (human immunodeficiency virus infection) [facility])     Past Surgical History  Procedure Laterality Date  . Ankle surgery  11/2014   Family History: History reviewed. No pertinent family history.   Family Psychiatric  History: unable to obtain Social History:  History  Alcohol Use  . Yes    Comment: occasionally     History  Drug Use  . Yes  . Special: Cocaine    Social History   Social History  . Marital Status: Married    Spouse Name: N/A  . Number of Children: N/A  . Years of Education: N/A   Social History Main Topics  . Smoking status: Current Every Day Smoker -- 1.00 packs/day    Types: Cigarettes  . Smokeless tobacco: None  . Alcohol Use: Yes     Comment: occasionally  . Drug Use: Yes    Special: Cocaine  . Sexual Activity: Not Asked   Other Topics Concern  . 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" 1 - Age of First Use: 35 yrs old  1 - Amount (size/oz): up to 3 grams per use 1 - Frequency: unk 1 - Duration: unk 1 - Last Use / Amount: October 2016/ 3grams per week  Name of Substance 2: Cocaine 2 - Age of First Use: 35 yrs old  2 - Amount (size/oz): 2-7 grams per episode 2 - Frequency: 2x/month  2 - Duration: on-going  2 - Last Use / Amount: Christmas Day  2016; 2-3 grams  Name of Substance 3: THC 3 - Age of First Use: 14-15 yrs old  3 - Amount (size/oz): "I use very lightly" 3 - Frequency: "rarely use" 3 - Duration: on-going  3 - Last Use / Amount: 11-02-15     Allergies:  No Known Allergies  Labs:  Results for orders placed or performed during the hospital encounter of 11/04/15 (from the past 48 hour(s))  Comprehensive metabolic panel     Status: Abnormal   Collection Time: 11/04/15 11:13 PM  Result Value Ref Range   Sodium 140 135 - 145 mmol/L   Potassium 4.3 3.5 - 5.1 mmol/L   Chloride 108 101 - 111 mmol/L   CO2 23 22 - 32 mmol/L   Glucose, Bld 104 (H) 65 - 99 mg/dL   BUN 14 6 - 20 mg/dL   Creatinine, Ser 0.79 0.61 - 1.24 mg/dL   Calcium 9.2 8.9 - 10.3 mg/dL   Total Protein 7.2 6.5 - 8.1 g/dL   Albumin 4.2 3.5 - 5.0 g/dL   AST 27 15 - 41 U/L   ALT 22 17 - 63 U/L   Alkaline Phosphatase 74 38 - 126 U/L   Total Bilirubin 0.5 0.3 - 1.2 mg/dL   GFR calc non Af Amer >60 >60 mL/min   GFR calc Af Amer >60 >60 mL/min    Comment: (NOTE) The eGFR has been calculated using the CKD EPI equation. This calculation has not been validated in all clinical situations. eGFR's persistently <60 mL/min signify possible Chronic Kidney Disease.    Anion gap 9 5 - 15  Ethanol (ETOH)     Status: None   Collection Time: 11/04/15 11:13 PM  Result Value Ref Range   Alcohol, Ethyl (B) <5 <5 mg/dL    Comment:        LOWEST DETECTABLE LIMIT FOR SERUM ALCOHOL IS 5 mg/dL FOR MEDICAL PURPOSES ONLY   Salicylate level     Status: None   Collection Time: 11/04/15 11:13 PM  Result Value Ref Range   Salicylate Lvl <0.9 2.8 - 30.0 mg/dL  Acetaminophen level     Status: Abnormal   Collection Time: 11/04/15 11:13 PM  Result Value Ref Range   Acetaminophen (Tylenol), Serum <10 (L) 10 - 30 ug/mL    Comment:        THERAPEUTIC CONCENTRATIONS VARY SIGNIFICANTLY. A RANGE OF 10-30 ug/mL MAY BE AN EFFECTIVE CONCENTRATION FOR MANY PATIENTS. HOWEVER, SOME  ARE BEST TREATED AT CONCENTRATIONS OUTSIDE THIS RANGE. ACETAMINOPHEN CONCENTRATIONS >150 ug/mL AT 4 HOURS AFTER INGESTION AND >50 ug/mL AT 12 HOURS AFTER INGESTION ARE OFTEN ASSOCIATED WITH TOXIC REACTIONS.   CBC     Status: Abnormal   Collection Time: 11/04/15 11:13 PM  Result Value Ref Range   WBC 12.6 (H) 4.0 - 10.5 K/uL   RBC 4.92 4.22 - 5.81 MIL/uL   Hemoglobin 16.6 13.0 - 17.0 g/dL   HCT 46.8 39.0 - 52.0 %  MCV 95.1 78.0 - 100.0 fL   MCH 33.7 26.0 - 34.0 pg   MCHC 35.5 30.0 - 36.0 g/dL   RDW 13.5 11.5 - 15.5 %   Platelets 326 150 - 400 K/uL  CBG monitoring, ED     Status: None   Collection Time: 11/05/15 12:14 AM  Result Value Ref Range   Glucose-Capillary 99 65 - 99 mg/dL  I-Stat Chem 8, ED     Status: Abnormal   Collection Time: 11/05/15 12:21 AM  Result Value Ref Range   Sodium 144 135 - 145 mmol/L   Potassium 4.2 3.5 - 5.1 mmol/L   Chloride 111 101 - 111 mmol/L   BUN 16 6 - 20 mg/dL   Creatinine, Ser 0.70 0.61 - 1.24 mg/dL   Glucose, Bld 108 (H) 65 - 99 mg/dL   Calcium, Ion 1.11 (L) 1.12 - 1.23 mmol/L   TCO2 25 0 - 100 mmol/L   Hemoglobin 14.6 13.0 - 17.0 g/dL   HCT 43.0 39.0 - 52.0 %  I-Stat CG4 Lactic Acid, ED     Status: None   Collection Time: 11/05/15 12:21 AM  Result Value Ref Range   Lactic Acid, Venous 1.52 0.5 - 2.0 mmol/L  Urine rapid drug screen (hosp performed) (Not at [facility])     Status: Abnormal   Collection Time: 11/05/15  1:32 AM  Result Value Ref Range   Opiates NONE DETECTED NONE DETECTED   Cocaine NONE DETECTED NONE DETECTED   Benzodiazepines POSITIVE (A) NONE DETECTED   Amphetamines NONE DETECTED NONE DETECTED   Tetrahydrocannabinol POSITIVE (A) NONE DETECTED   Barbiturates NONE DETECTED NONE DETECTED    Comment:        DRUG SCREEN FOR MEDICAL PURPOSES ONLY.  IF CONFIRMATION IS NEEDED FOR ANY PURPOSE, NOTIFY LAB WITHIN 5 DAYS.        LOWEST DETECTABLE LIMITS FOR URINE DRUG SCREEN Drug Class       Cutoff (ng/mL) Amphetamine       1000 Barbiturate      200 Benzodiazepine   415 Tricyclics       830 Opiates          300 Cocaine          300 THC              50   I-Stat CG4 Lactic Acid, ED     Status: None   Collection Time: 11/05/15  2:16 AM  Result Value Ref Range   Lactic Acid, Venous 0.68 0.5 - 2.0 mmol/L    Current Facility-Administered Medications  Medication Dose Route Frequency Provider Last Rate Last Dose  . acetaminophen (TYLENOL) tablet 650 mg  650 mg Oral Q4H PRN April Palumbo, MD      . alum & mag hydroxide-simeth (MAALOX/MYLANTA) 200-200-20 MG/5ML suspension 30 mL  30 mL Oral PRN April Palumbo, MD      . ibuprofen (ADVIL,MOTRIN) tablet 600 mg  600 mg Oral Q8H PRN April Palumbo, MD      . [COMPLETED] nicotine (NICODERM CQ - dosed in mg/24 hours) patch 21 mg  21 mg Transdermal Once April Palumbo, MD   21 mg at 11/05/15 9407  . ondansetron [facility]) tablet 4 mg  4 mg Oral Q8H PRN April Palumbo, MD      . ziprasidone (GEODON) injection 10 mg  10 mg Intramuscular Once April Palumbo, MD   10 mg at 11/05/15 0607  . ziprasidone (GEODON) injection 20 mg  20 mg Intramuscular Once April Palumbo,  MD   Stopped at 11/05/15 0711   Current Outpatient Prescriptions  Medication Sig Dispense Refill  . efavirenz-emtricitabine-tenofovir (ATRIPLA) 600-200-300 MG tablet Take 1 tablet by mouth at bedtime.      Musculoskeletal: Strength & Muscle Tone: lying down in bed sleeping off and on after receiving [place]: lying down sleeping off and on Patient leans: lying down in bed  Psychiatric Specialty Exam: Review of Systems  Unable to perform ROS: mental acuity    Blood pressure 108/82, pulse 83, temperature 98.2 F (36.8 C), temperature source Oral, resp. rate 24, SpO2 100 %.There is no height or weight on file to calculate BMI.  General Appearance: Casual and Fairly Groomed  Engineer, water::  Minimal  Speech:  Slurred  Volume:  Decreased  Mood:  unable to obtain, patient sleeping off and unable to  finish a sentence  Affect:  Congruent and sleeping off and on  Thought Process:  unable to obtain  Orientation:  Other:  unable to obtain  Thought Content:  unable to obtain  Suicidal Thoughts:  unable to obtain, ingested about 30 mg of Xanax before arriving at the hospital  Homicidal Thoughts:  unable to obtain  Memory:  unable to obtain  Judgement:  Poor  Insight:  unable to obtain  Psychomotor Activity:  Psychomotor Retardation  Concentration:  sleeping off and on after receiving Geodon  Recall:  unable to obtain  [place] to obtain  Language: minimal sleeping in between sentences.  Akathisia:  No  Handed:  Right  AIMS (if indicated):     Assets:  Others:  unable to obtain  ADL's:  Intact  Cognition: Impaired,  Mild  Sleep:      Treatment Plan Summary: Daily contact with patient to assess and evaluate symptoms and progress in treatment and Medication management  Disposition:  Accepted for admission and we will be seeking placement at any facility with available bed.  We will offer Ativan for Benzodiazepine withdrawal.  Delfin Gant   PMHNP-BC 11/05/2015 12:51 PM Agree with NP Assessment and Plan as above

## 2015-11-05 NOTE — ED Notes (Signed)
Pt. Up banging on the mag lock doors, Tearing off the covers over the door swipe openers. Pt. Yelling and disturbing all the other patients. Pt. Trying to tear off phone ear piece.

## 2015-11-05 NOTE — ED Notes (Signed)
Report received from LuAnn RN. Pt. Sleeping, respirations regular and unlabored..  Will continue to monitor for safety via security cameras and Q 15 minute checks. 

## 2015-11-06 ENCOUNTER — Encounter (HOSPITAL_COMMUNITY): Payer: Self-pay | Admitting: Psychiatry

## 2015-11-06 DIAGNOSIS — F1910 Other psychoactive substance abuse, uncomplicated: Secondary | ICD-10-CM | POA: Diagnosis present

## 2015-11-06 DIAGNOSIS — F3181 Bipolar II disorder: Principal | ICD-10-CM

## 2015-11-06 DIAGNOSIS — F319 Bipolar disorder, unspecified: Secondary | ICD-10-CM | POA: Diagnosis present

## 2015-11-06 MED ORDER — LAMOTRIGINE 25 MG PO TABS
25.0000 mg | ORAL_TABLET | Freq: Every day | ORAL | Status: DC
  Administered 2015-11-06 – 2015-11-08 (×3): 25 mg via ORAL
  Filled 2015-11-06 (×6): qty 1

## 2015-11-06 MED ORDER — LURASIDONE HCL 20 MG PO TABS
20.0000 mg | ORAL_TABLET | Freq: Every day | ORAL | Status: DC
  Administered 2015-11-07 – 2015-11-08 (×2): 20 mg via ORAL
  Filled 2015-11-06 (×3): qty 1

## 2015-11-06 MED ORDER — LORAZEPAM 1 MG PO TABS
1.0000 mg | ORAL_TABLET | Freq: Four times a day (QID) | ORAL | Status: DC | PRN
  Administered 2015-11-06 – 2015-11-08 (×3): 1 mg via ORAL
  Filled 2015-11-06 (×3): qty 1

## 2015-11-06 MED ORDER — NICOTINE 21 MG/24HR TD PT24
21.0000 mg | MEDICATED_PATCH | Freq: Every day | TRANSDERMAL | Status: DC
  Administered 2015-11-06: 08:00:00 via TRANSDERMAL
  Administered 2015-11-07 – 2015-11-08 (×2): 21 mg via TRANSDERMAL
  Filled 2015-11-06 (×5): qty 1

## 2015-11-06 MED ORDER — NICOTINE 21 MG/24HR TD PT24
MEDICATED_PATCH | TRANSDERMAL | Status: AC
  Filled 2015-11-06: qty 1
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
## 2015-11-06 NOTE — Plan of Care (Signed)
Problem: Diagnosis: Increased Risk For Suicide Attempt Goal: STG-Patient Will Attend All Groups On The Unit Outcome: Progressing Patient is attending groups and participating.
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
## 2015-11-06 NOTE — Progress Notes (Signed)
D: Pt denies SI/HI/AVH. Pt is pleasant and cooperative. Pt stated he was doing good, he saw the doctor and getting medications in him. Pt seen interacting on the unit with peers in the dayroom, and pt presents with bright affect.   A: Pt was offered support and encouragement. Pt was given scheduled medications. Pt was encourage to attend groups. Q 15 minute checks were done for safety.  R:Pt attends groups and interacts well with peers and staff. Pt is taking medication. Pt has no complaints at this time .Pt receptive to treatment and safety maintained on unit.

## 2015-11-06 NOTE — Progress Notes (Signed)
Recreation Therapy Notes  Date: 01.02.2017 Time: 9:30am Location: 300 [address] Group Room   Group Topic: Stress Management  Goal Area(s) Addresses:  Patient will actively participate in stress management techniques presented during session.   Behavioral Response: Appropriate   Intervention: Stress management techniques  Activity :  Deep Breathing and Progressive Muscle Relaxation. LRT provided instruction and demonstration on practice of Progressive Muscle Relaxation. Technique was coupled with deep breathing.   Education:  Stress Management, Discharge Planning.   Education Outcome: Acknowledges education  Clinical Observations/Feedback: Patient actively engaged in technique introduced, expressed no concerns and demonstrated ability to practice independently post d/c.   Marykay Lexenise L Zeno Hickel, LRT/CTRS  Warner Laduca L 11/06/2015 10:24 AM

## 2015-11-06 NOTE — Plan of Care (Signed)
Problem: Ineffective individual coping Goal: STG: Patient will remain free from self harm Outcome: Progressing Patient reports he has not had any suicidal ideation today.  He remains free from self harm.
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
## 2015-11-06 NOTE — Progress Notes (Signed)
D: Patient reports poor sleep; good appetite; poor concentration and low energy.  He rates his depression as an 8; hopelessness as a 5; anxiety as a 7.  He presents with depressed mood; he is pleasant and cooperative.  He denies any withdrawal symptoms or physical pain.  His goal today is to "condense my thoughts into a dialogue for exhange with the psychiatrist or PA."  Patient reported that he needed computer access to pay his rent.  Advised patient to get with his Child psychotherapistsocial worker and they will probably be able to assist him with that.  He is up in the milieu interacting well with his peers.  He has been attending groups and participating. A: Continue to monitor medication management and MD orders.  Safety checks completed every 15 minutes per protocol.  Offer support and encouragement as needed. R: Patient is receptive to staff; his behavior is appropriate.

## 2015-11-06 NOTE — [facility] Group Notes (Signed)
   [facility]  11/06/2015  8:45 AM   Participation Quality: Alert, Appropriate and Oriented  Mood/Affect: Anxious  Depression Rating: 7.5  Anxiety Rating: 8.5  Thoughts of Suicide: Pt denies SI/HI  Will you contract for safety? Yes  Current AVH: Pt denies  Plan for Discharge/Comments: Pt attended discharge planning group and actively participated in group. CSW provided pt with today's workbook. [place] services.   Transportation Means: Pt reports access to transportation  Supports: No supports mentioned at this time  Samuella BruinKristin Nuala Chiles, MSW, [employer]CSWA Clinical Social Worker [facility] [phone]

## 2015-11-06 NOTE — Progress Notes (Signed)
Adult Psychoeducational Group Note  Date:  11/06/2015 Time:  8:20 PM  Group Topic/Focus:  Wrap-Up Group:   The focus of this group is to help patients review their daily goal of treatment and discuss progress on daily workbooks.  Participation Level:  Active  Participation Quality:  Appropriate  Affect:  Appropriate  Cognitive:  Appropriate  Insight: Good  Engagement in Group:  Engaged  Modes of Intervention:  Discussion  Additional Comments:  Pt rated overall day a 7 and a half out of 10 because his day started out rough, but after coming here and interacting with other patients on the unit, his day progressed and ended well. Pt reported that he did not have a goal for the day. Pt noted that the highlight of his day was meeting with his doctor as well as the grief counseling group session that he attended.   Cleotilde NeerJasmine S Rhys Lichty 11/06/2015, 8:53 PM

## 2015-11-06 NOTE — Plan of Care (Signed)
Problem: Alteration in mood Goal: LTG-Patient reports reduction in suicidal thoughts (Patient reports reduction in suicidal thoughts and is able to verbalize a safety plan for whenever patient is feeling suicidal)  Outcome: Progressing Pt denies Si at this time     
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
## 2015-11-07 NOTE — Progress Notes (Signed)
Adult Psychoeducational Group Note  Date:  11/07/2015 Time:  9:13 PM  Group Topic/Focus:  Wrap-Up Group:   The focus of this group is to help patients review their daily goal of treatment and discuss progress on daily workbooks.  Participation Level:  Active  Participation Quality:  Appropriate  Affect:  Appropriate  Cognitive:  Alert  Insight: Appropriate  Engagement in Group:  Engaged  Modes of Intervention:  Discussion  Additional Comments:  Patient stated having a good day. Patient stated something positive that happened today was him going to pet therapy. Patient stated he is ready for discharge tomorrow.   Felicity Penix L Keron Neenan 11/07/2015, 9:13 PM

## 2015-11-07 NOTE — BHH Suicide Risk Assessment (Signed)
[facility] INPATIENT:  Family/Significant Other Suicide Prevention Education  Suicide Prevention Education:  Education Completed; Cherene JulianCousin Stacey Combs [phone],  (name of family member/significant other) has been identified by the patient as the family member/significant other with whom the patient will be residing, and identified as the person(s) who will aid the patient in the event of a mental health crisis (suicidal ideations/suicide attempt).  With written consent from the patient, the family member/significant other has been provided the following suicide prevention education, prior to the and/or following the discharge of the patient.  The suicide prevention education provided includes the following:  Suicide risk factors  Suicide prevention and interventions  National Suicide Hotline telephone number  [facility] assessment telephone number  [facility] Emergency Assistance 911  [facility] and/or Residential Mobile Crisis Unit telephone number  Request made of family/significant other to:  Remove weapons (e.g., guns, rifles, knives), all items previously/currently identified as safety concern.    Remove drugs/medications (over-the-counter, prescriptions, illicit drugs), all items previously/currently identified as a safety concern.  The family member/significant other verbalizes understanding of the suicide prevention education information provided.  The family member/significant other agrees to remove the items of safety concern listed above.  Shelby Peltz, [place]ristin L 11/07/2015, 12:45 PM

## 2015-11-07 NOTE — BHH Group Notes (Signed)

## 2015-11-07 NOTE — Progress Notes (Signed)
D:  Patient's self inventory sheet, patient has fair sleep, no sleep medication given.  Good appetite, low energy level, good concentration.  Rated depression 6, hopeless and anxiety 5.  Denied withdrawals.  Denied SI.  Denied physical problems.  Denied pain.  Goal is "considering obstacles that lie ahead upon discharge and how to manipulate or navigate through or around them.  Consider naming some of these obstacles and outcomes in group."  Does have discharge plans. A:  Medications administered per MD orders.  Emotional support and encouragement given patient. R:  Denied SI and HI, contracts for safety.  Denied A/V hallucinations.  Safety maintained with 15 minute checks.

## 2015-11-07 NOTE — Progress Notes (Signed)
D: Patient in the dayroom on approach.  Patient states he had a good day.  Patient states he was able to talk to his family and that made him happy.  Patient has bright/anxious affect.  Patient also states now tha he has a diagnosis he is relieved and now knows signs ans symptoms so he can get help.  Patient denies SI/HI and denies AVH.   A: Staff to monitor Q 15 mins for safety.  Encouragement and support offered.  Scheduled medications administered per orders.  Vistaril administered prn.  Trazodone administered prn. R: Patient remains safe on the unit.  Patient attended group tonight.  Patient visible on the unit and interacting with peers.  Patient taking administered medications.

## 2015-11-07 NOTE — Progress Notes (Signed)
Recreation Therapy Notes  Animal-Assisted Activity (AAA) Program Checklist/Progress Notes Patient Eligibility Criteria Checklist & Daily Group note for Rec Tx Intervention  Date: 01.03.2017 Time: 2:45pm Location: 400 Hall Dayroom    AAA/T Program Assumption of Risk Form signed by Patient/ or Parent Legal Guardian yes  Patient is free of allergies or sever asthma yes  Patient reports no fear of animals yes  Patient reports no history of cruelty to animals yes  Patient understands his/her participation is voluntary yes  Patient washes hands before animal contact yes  Patient washes hands after animal contact yes  Behavioral Response: Appropriate   Education: Hand Washing, Appropriate Animal Interaction   Education Outcome: Acknowledges education.   Clinical Observations/Feedback: Patient engaged appropriately with therapy dog and peers during session. Additionally patient asked appropriate questions about therapy dog and his training.   Jeffrey Mora, LRT/CTRS  Jeffrey Mora L 11/07/2015 2:04 PM 

## 2015-11-07 NOTE — Plan of Care (Signed)
Problem: Consults Goal: Depression Patient Education See Patient Education Module for education specifics.  Outcome: Progressing Nurse discussed depression/coping skills with patient.        

## 2015-11-07 NOTE — Progress Notes (Signed)
[facility] MD Progress Note  11/07/2015 3:49 PM Jeffrey Mora  MRN:  [id number] Subjective:   Patient reports he feels better. Denies any withdrawal symptoms  At this time he denies any suicidal ideations. Tolerating medications well.   Objective : I have discussed case with treatment team and have met with patient. Patient reports he is feeling better, denies any current suicidal ideations, and states he is feeling calmer " because he has decided what to do" regarding his relationship stressors with spouse .  States he has decided to give spouse " another chance" and that in a few months they may be moving out of state, which he sees as beneficial for both of them. However, if spouse continues in active addiction, patient has decided to end relationship . At present reports improved mood and is not feeling significantly depressed at this time. No withdrawal symptoms- no tremors, no diaphoresis, no acute distress /restlessness. Tolerating medications well at this time. Behavior on unit in good control, going to groups .  Principal Problem: Bipolar 2 disorder ([place]) Diagnosis:   Patient Active Problem List   Diagnosis Date Noted  . Bipolar 2 disorder ([place]) [F31.81] 11/06/2015  . Polysubstance abuse [F19.10] 11/06/2015  . Drug overdose, intentional ([place]) [T50.902A]    Total Time spent with patient: 20 minutes     Past Medical History:  Past Medical History  Diagnosis Date  . HIV (human immunodeficiency virus infection) [facility])     Past Surgical History  Procedure Laterality Date  . Ankle surgery  11/2014   Family History: History reviewed. No pertinent family history.  Social History:  History  Alcohol Use  . Yes    Comment: occasionally     History  Drug Use  . Yes  . Special: Cocaine    Social History   Social History  . Marital Status: Married    Spouse Name: N/A  . Number of Children: N/A  . Years of Education: N/A   Social History Main Topics  . Smoking status: Current Every  Day Smoker -- 1.00 packs/day for 15 years    Types: Cigarettes  . Smokeless tobacco: None  . Alcohol Use: Yes     Comment: occasionally  . Drug Use: Yes    Special: Cocaine  . Sexual Activity: Yes    Birth Control/ Protection: None   Other Topics Concern  . None   Social History Narrative   Additional Social History:   Sleep: Good  Appetite:  Good  Current Medications: Current Facility-Administered Medications  Medication Dose Route Frequency Provider Last Rate Last Dose  . acetaminophen (TYLENOL) tablet 650 mg  650 mg Oral Q6H PRN Harriet Butte, NP      . alum & mag hydroxide-simeth (MAALOX/MYLANTA) 200-200-20 MG/5ML suspension 30 mL  30 mL Oral Q4H PRN Harriet Butte, NP      . efavirenz-emtricitabine-tenofovir (ATRIPLA) 600-200-300 MG per tablet 1 tablet  1 tablet Oral QHS Harriet Butte, NP   1 tablet at 11/06/15 2247  . hydrOXYzine (ATARAX/VISTARIL) tablet 25 mg  25 mg Oral TID PRN Harriet Butte, NP   25 mg at 11/06/15 2247  . lamoTRIgine (LAMICTAL) tablet 25 mg  25 mg Oral Daily Nicholaus Bloom, MD   25 mg at 11/07/15 4562  . LORazepam (ATIVAN) tablet 1 mg  1 mg Oral Q6H PRN Nicholaus Bloom, MD   1 mg at 11/06/15 2247  . lurasidone (LATUDA) tablet 20 mg  20 mg Oral Q breakfast Geralyn Flash  Brett Fairy, MD   20 mg at 11/07/15 0824  . magnesium hydroxide (MILK OF MAGNESIA) suspension 30 mL  30 mL Oral Daily PRN Harriet Butte, NP      . nicotine (NICODERM CQ - dosed in mg/24 hours) patch 21 mg  21 mg Transdermal Daily Jenne Campus, MD   21 mg at 11/07/15 0824  . traZODone (DESYREL) tablet 50 mg  50 mg Oral QHS PRN Harriet Butte, NP   50 mg at 11/06/15 2247    Lab Results: No results found for this or any previous visit (from the past 48 hour(s)).  Physical Findings: AIMS: Facial and Oral Movements Muscles of Facial Expression: None, normal Lips and Perioral Area: None, normal Jaw: None, normal Tongue: None, normal,Extremity Movements Upper (arms, wrists, hands, fingers):  None, normal Lower (legs, knees, ankles, toes): None, normal, Trunk Movements Neck, shoulders, hips: None, normal, Overall Severity Severity of abnormal movements (highest score from questions above): None, normal Incapacitation due to abnormal movements: None, normal Patient's awareness of abnormal movements (rate only patient's report): No Awareness, Dental Status Current problems with teeth and/or dentures?: No Does patient usually wear dentures?: No  CIWA:  CIWA-Ar Total: 1 COWS:  COWS Total Score: 2  Musculoskeletal: Strength & Muscle Tone: within normal limits Gait & Station: normal Patient leans: N/A  Psychiatric Specialty Exam: ROS denies headache, no visual symptoms, no chest pain, no shortness of breath, no rash   Blood pressure 120/81, pulse 90, temperature 97.8 F (36.6 C), temperature source Oral, resp. rate 16, height 6' (1.829 m), weight 173 lb (78.472 kg), SpO2 99 %.Body mass index is 23.46 kg/(m^2).  General Appearance: Well Groomed  Engineer, water::  Good  Speech:  Normal Rate  Volume:  Normal  Mood:  improved, less depressed   Affect:  reactive, smiles at times appropriately  Thought Process:  Linear  Orientation:  Full (Time, Place, and Person)  Thought Content:  denies hallucinations, no delusions   Suicidal Thoughts:  No- denies any suicidal ideations or any self injurious ideations   Homicidal Thoughts:  No- denies any homicidal or violent ideations, specifically also denies any violent ideations towards spouse   Memory:  recent and remote grossly intact   Judgement:  Other:  improving   Insight:  Fair  Psychomotor Activity:  Normal- no tremors, no diaphoresis, no restlessness  Concentration:  Good  Recall:  Good  Fund of Knowledge:Good  Language: Negative  Akathisia:  Negative  Handed:  Right  AIMS (if indicated):     Assets:  Desire for Improvement Resilience  ADL's:  Intact  Cognition: WNL  Sleep:  Number of Hours: 6  Assessment - at this time  patient improved, - currently minimizes symptoms of depression and presents with full range of affect. Denies any current SI and is future oriented . Tolerating medications well . Not presenting with any symptoms of WDL . Treatment Plan Summary: Daily contact with patient to assess and evaluate symptoms and progress in treatment, Medication management, Plan inpatient treatment  and medications as below  Continue to encourage group/milieu participation to work on coping skills and symptom reduction Continue antiretroviral medications for HIV management- after discharge will follow with ID clinic in [place] 25 mgrs QDAY for mood disorder / bipolar disorder  Continue Latuda 20 mgrs QDAY for  Mood disorder/ bipolar disorder  Continue Trazodone 50 mgrs QHS PRN for insomnia as needed  COBOS, FERNANDO 11/07/2015, 3:49 PM

## 2015-11-08 MED ORDER — LURASIDONE HCL 40 MG PO TABS
40.0000 mg | ORAL_TABLET | Freq: Every day | ORAL | Status: DC
  Filled 2015-11-08 (×2): qty 1

## 2015-11-08 MED ORDER — LURASIDONE HCL 20 MG PO TABS: 20.0000 mg | ORAL_TABLET | Freq: Every day | ORAL | Status: DC

## 2015-11-08 MED ORDER — HYDROXYZINE HCL 25 MG PO TABS: 25.0000 mg | ORAL_TABLET | Freq: Three times a day (TID) | ORAL | Status: DC | PRN

## 2015-11-08 MED ORDER — LAMOTRIGINE 25 MG PO TABS: 25.0000 mg | ORAL_TABLET | Freq: Every day | ORAL | Status: AC

## 2015-11-08 MED ORDER — EFAVIRENZ-EMTRICITAB-TENOFOVIR 600-200-300 MG PO TABS: 1.0000 | ORAL_TABLET | Freq: Every day | ORAL | Status: AC

## 2015-11-08 MED ORDER — LURASIDONE HCL 40 MG PO TABS: 40.0000 mg | ORAL_TABLET | Freq: Every day | ORAL | Status: AC

## 2015-11-08 MED ORDER — TRAZODONE HCL 50 MG PO TABS: 50.0000 mg | ORAL_TABLET | Freq: Every evening | ORAL | Status: AC | PRN

## 2015-11-08 MED ORDER — NICOTINE 21 MG/24HR TD PT24: 21.0000 mg | MEDICATED_PATCH | Freq: Every day | TRANSDERMAL | Status: DC

## 2015-11-08 NOTE — BHH Suicide Risk Assessment (Signed)
[facility] Discharge Suicide Risk Assessment   Demographic Factors:  35 year old married male, lives with spouse , enrolled in college   Total Time spent with patient: 30 minutes  Musculoskeletal: Strength & Muscle Tone: within normal limits Gait & Station: normal Patient leans: N/A  Psychiatric Specialty Exam: Physical Exam  ROS  Blood pressure 115/81, pulse 90, temperature 97.9 F (36.6 C), temperature source Oral, resp. rate 20, height 6' (1.829 m), weight 173 lb (78.472 kg), SpO2 99 %.Body mass index is 23.46 kg/(m^2).  General Appearance: Well Groomed  Eye Contact::  Good  Speech:  Normal Rate409  Volume:  Normal  Mood:  euthymic, denies depression at this time  Affect:  Appropriate and Full Range  Thought Process:  Linear  Orientation:  Full (Time, Place, and Person)  Thought Content:  No hallucinations, no delusions   Suicidal Thoughts:   Denies suicidal ideations, no self injurious ideations  Homicidal Thoughts:  No  Memory:  recent and remote grossly intact   Judgement:  Other:  improved   Insight:  improved  Psychomotor Activity:  Normal  Concentration:  Good  Recall:  Good  Fund of Knowledge:Good  Language: Good  Akathisia:  Negative  Handed:  Right  AIMS (if indicated):     Assets:  Communication Skills Desire for Improvement  Sleep:  Number of Hours: 5.75  Cognition: WNL  ADL's:  Intact   Have you used any form of tobacco in the last 30 days? (Cigarettes, Smokeless Tobacco, Cigars, and/or Pipes): Yes  Has this patient used any form of tobacco in the last 30 days? (Cigarettes, Smokeless Tobacco, Cigars, and/or Pipes) - discussed importance of smoking cessation efforts, patient plans to continue nicoderm patches .  Mental Status Per Nursing Assessment::   On Admission:     Current Mental Status by Physician: Alert and attentive, well related, pleasant, cooperative, good eye contact , mood improved, euthymic, talkative but less pressured, no thought disorder,  no SI, no HI, no psychotic symptoms, future oriented, plans to return to college next week .  Loss Factors: Marital stress, related to spouse abusing substances  Historical Factors: History of Bipolar Disorder .  History of substance abuse . No prior history of suicide attempts .  Risk Reduction Factors:   No gross cognitive deficits noted upon discharge. Is alert , attentive, and oriented x 3   Continued Clinical Symptoms:  At this time patient improved compared to admission- patient denies depression and presents euthymic. Speech increased but not pressured- no thought disorder,  No psychosis, no SI or HI , future oriented. Tolerating Latuda and Lamictal well thus far- no side effects, we reviewed side effect profile, to include risk of rash on Lamictal. We agreed to increase Latuda to 40 mgrs QDAY as tolerating lower dose well.   Cognitive Features That Contribute To Risk:  No gross cognitive deficits noted upon discharge. Is alert , attentive, and oriented x 3    Suicide Risk:  Mild:  Suicidal ideation of limited frequency, intensity, duration, and specificity.  There are no identifiable plans, no associated intent, mild dysphoria and related symptoms, good self-control (both objective and subjective assessment), few other risk factors, and identifiable protective factors, including available and accessible social support.  Principal Problem: Bipolar 2 disorder [facility]) Discharge Diagnoses:  Patient Active Problem List   Diagnosis Date Noted  . Bipolar 2 disorder ([facility]) [F31.81] 11/06/2015  . Polysubstance abuse [F19.10] 11/06/2015  . Drug overdose, intentional ([facility]) [T50.902A]     Follow-up  Information    Follow up with [facility].   Why:  Referral made on 11/07/15. Call office at discharge to schedule appointments for therapy and medication management services.    Contact information:   [address],  [place], [place] [address] Phone: [phone]      [facility].   Why:  Crisis management appointment with Genia DelShelby Carlson on Wednesday January 11th at 3pm. Call office if you need to reschedule.   Contact information:   Marlana Salvagenna M. [facility] [address] [place], [place] [address] [phone]      Plan Of Care/Follow-up recommendations:  Activity:  as tolerated  Diet:  regular Tests:  NA Other:  See below   Is patient on multiple antipsychotic therapies at discharge:  No   Has Patient had three or more failed trials of antipsychotic monotherapy by history:  No  Recommended Plan for Multiple Antipsychotic Therapies: NA Patient requesting discharge and at this time there are no further grounds for involuntary commitment  Leaving unit in good spirits. States he plans to return home. States he plans to go live with his mother for the time being " if my spouse is still actively abusing drugs ".  Encouraged to continue focusing on early recovery/relapse prevention efforts.  COBOS, FERNANDO 11/08/2015, 10:32 AM

## 2015-11-08 NOTE — Progress Notes (Signed)
Recreation Therapy Notes  Date: 01.04.2017 Time: 9:30am Location: [address] Group Room   Group Topic: Stress Management  Goal Area(s) Addresses:  Patient will actively participate in stress management techniques presented during session.   Behavioral Response: Appropriate   Intervention: Stress management techniques  Activity :  Deep Breathing and Guided Imagery. LRT provided instruction and demonstration on practice of Guided Imagery. Technique was coupled with deep breathing.   Education:  Stress Management, Discharge Planning.   Education Outcome: Acknowledges education  Clinical Observations/Feedback: Patient actively engaged in technique introduced, expressed no concerns and demonstrated ability to practice independently post d/c.   Finnean Cerami L Jayleigh Notarianni, LRT/CTRS        Wyatt Galvan L 11/08/2015 12:53 PM 

## 2015-11-08 NOTE — [facility] Group Notes (Signed)
   [facility] Note  11/08/2015  8:45 AM   Participation Quality: Alert, Appropriate and Oriented  Mood/Affect: Appropriate  Depression Rating: 6  Anxiety Rating: 7.5  Thoughts of Suicide: Pt denies SI/HI  Will you contract for safety? Yes  Current AVH: Pt denies  Plan for Discharge/Comments: Pt attended discharge planning group and actively participated in group. CSW provided pt with today's workbook. Patient reports having difficulty sleeping last night but reports feeling safe to leave today. He will return home to follow up with outpatient services at discharge.  Transportation Means: Pt reports access to transportation  Supports: No supports mentioned at this time  Samuella BruinKristin Gabriella Woodhead, MSW, [employer]CSWA Clinical Social Worker [facility] [phone]

## 2015-11-08 NOTE — Progress Notes (Signed)
Discharge note: Pt received both written and verbal discharge instructions. Pt verbalized understanding of discharge instructions. Pt agreed to f/u appt and med regimen. Pt received prescriptions and belongings. Pt safely discharged to lobby.

## 2015-11-08 NOTE — Progress Notes (Signed)
  [facility] :  Will you be returning to the same living situation after discharge:  Yes,  patient plans to return home At discharge, do you have transportation home?: Yes,  patient has car at hospital Do you have the ability to pay for your medications: Yes,  patient will be provided with prescriptions at discharge  Release of information consent forms completed and in the chart;  Patient's signature needed at discharge.  Patient to Follow up at: Follow-up Information    Follow up with [facility].   Why:  Therapy appt with Dahlia ClientHannah on Tuesday Jan. 10th at 11am. Medication management appt with Crystal on Friday Jan. 13th at 2pm. Please call office if you need to reschedule appointments.    Contact information:   [address],  [place], [place] [address] Phone: [phone]      [facility].   Why:  Crisis management appointment with Genia DelShelby Carlson on Wednesday January 11th at 3pm. Call office if you need to reschedule.   Contact information:   Marlana Salvagenna M. [facility] [address] [place], [place] [address] [phone]      Next level of care provider has access to [facility] Link:no  Safety Planning and Suicide Prevention discussed: Yes,  with patient and cousin  Have you used any form of tobacco in the last 30 days? (Cigarettes, Smokeless Tobacco, Cigars, and/or Pipes): Yes  Has patient been referred to the Quitline?: Patient refused referral  Patient has been referred for addiction treatment: Yes- outpatient  Sianna Garofano, [place]ristin L 11/08/2015, 11:31 AM
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
## 2015-11-30 ENCOUNTER — Encounter (HOSPITAL_COMMUNITY): Payer: Self-pay

## 2015-11-30 ENCOUNTER — Emergency Department (HOSPITAL_COMMUNITY)
Admission: EM | Admit: 2015-11-30 | Discharge: 2015-11-30 | Discharge status: Against Medical Advice | Payer: Medicare HMO | Attending: Emergency Medicine | Admitting: Emergency Medicine

## 2015-11-30 DIAGNOSIS — F17210 Nicotine dependence, cigarettes, uncomplicated: Secondary | ICD-10-CM | POA: Insufficient documentation

## 2015-11-30 DIAGNOSIS — F1910 Other psychoactive substance abuse, uncomplicated: Secondary | ICD-10-CM

## 2015-11-30 DIAGNOSIS — F319 Bipolar disorder, unspecified: Secondary | ICD-10-CM | POA: Insufficient documentation

## 2015-11-30 DIAGNOSIS — B20 Human immunodeficiency virus [HIV] disease: Secondary | ICD-10-CM | POA: Diagnosis not present

## 2015-11-30 DIAGNOSIS — F1210 Cannabis abuse, uncomplicated: Secondary | ICD-10-CM | POA: Insufficient documentation

## 2015-11-30 DIAGNOSIS — F10129 Alcohol abuse with intoxication, unspecified: Secondary | ICD-10-CM | POA: Diagnosis present

## 2015-11-30 DIAGNOSIS — Z79899 Other long term (current) drug therapy: Secondary | ICD-10-CM | POA: Insufficient documentation

## 2015-11-30 DIAGNOSIS — F1410 Cocaine abuse, uncomplicated: Secondary | ICD-10-CM | POA: Diagnosis not present

## 2015-11-30 HISTORY — DX: Bipolar disorder, unspecified: F31.9

## 2015-11-30 HISTORY — DX: Other psychoactive substance abuse, uncomplicated: F19.10

## 2015-11-30 LAB — CBC WITH DIFFERENTIAL/PLATELET
BASOS PCT: 0 %
Basophils Absolute: 0 10*3/uL (ref 0.0–0.1)
EOS ABS: 0 10*3/uL (ref 0.0–0.7)
EOS PCT: 0 %
HCT: 48.1 % (ref 39.0–52.0)
Hemoglobin: 16.7 g/dL (ref 13.0–17.0)
LYMPHS ABS: 3 10*3/uL (ref 0.7–4.0)
Lymphocytes Relative: 26 %
MCH: 33.7 pg (ref 26.0–34.0)
MCHC: 34.7 g/dL (ref 30.0–36.0)
MCV: 97.2 fL (ref 78.0–100.0)
MONOS PCT: 5 %
Monocytes Absolute: 0.6 10*3/uL (ref 0.1–1.0)
Neutro Abs: 7.7 10*3/uL (ref 1.7–7.7)
Neutrophils Relative %: 69 %
PLATELETS: 322 10*3/uL (ref 150–400)
RBC: 4.95 MIL/uL (ref 4.22–5.81)
RDW: 13.6 % (ref 11.5–15.5)
WBC: 11.3 10*3/uL — AB (ref 4.0–10.5)

## 2015-11-30 LAB — I-STAT CHEM 8, ED
BUN: 11 mg/dL (ref 6–20)
CALCIUM ION: 1.05 mmol/L — AB (ref 1.12–1.23)
Chloride: 107 mmol/L (ref 101–111)
Creatinine, Ser: 1.1 mg/dL (ref 0.61–1.24)
Glucose, Bld: 105 mg/dL — ABNORMAL HIGH (ref 65–99)
HEMATOCRIT: 52 % (ref 39.0–52.0)
HEMOGLOBIN: 17.7 g/dL — AB (ref 13.0–17.0)
Potassium: 3.7 mmol/L (ref 3.5–5.1)
SODIUM: 144 mmol/L (ref 135–145)
TCO2: 23 mmol/L (ref 0–100)

## 2015-11-30 LAB — CBG MONITORING, ED: Glucose-Capillary: 102 mg/dL — ABNORMAL HIGH (ref 65–99)

## 2015-11-30 MED ORDER — SODIUM CHLORIDE 0.9 % IV BOLUS (SEPSIS)
1000.0000 mL | Freq: Once | INTRAVENOUS | Status: AC
  Administered 2015-11-30: 1000 mL via INTRAVENOUS

## 2015-11-30 MED ORDER — ONDANSETRON HCL 4 MG/2ML IJ SOLN
4.0000 mg | Freq: Once | INTRAMUSCULAR | Status: AC
  Administered 2015-11-30: 4 mg via INTRAVENOUS
  Filled 2015-11-30: qty 2

## 2015-11-30 NOTE — ED Notes (Signed)
MD At bedside
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
## 2015-12-14 ENCOUNTER — Emergency Department (HOSPITAL_COMMUNITY): Payer: Medicare HMO

## 2015-12-14 ENCOUNTER — Emergency Department (HOSPITAL_COMMUNITY)
Admission: EM | Admit: 2015-12-14 | Discharge: 2015-12-14 | Disposition: A | Discharge status: Home / Self Care | Payer: Medicare HMO | Attending: Emergency Medicine | Admitting: Emergency Medicine

## 2015-12-14 ENCOUNTER — Encounter (HOSPITAL_COMMUNITY): Payer: Self-pay | Admitting: Emergency Medicine

## 2015-12-14 DIAGNOSIS — S7011XA Contusion of right thigh, initial encounter: Secondary | ICD-10-CM | POA: Diagnosis not present

## 2015-12-14 DIAGNOSIS — S90511A Abrasion, right ankle, initial encounter: Secondary | ICD-10-CM | POA: Insufficient documentation

## 2015-12-14 DIAGNOSIS — S0083XA Contusion of other part of head, initial encounter: Secondary | ICD-10-CM | POA: Diagnosis not present

## 2015-12-14 DIAGNOSIS — S29001A Unspecified injury of muscle and tendon of front wall of thorax, initial encounter: Secondary | ICD-10-CM | POA: Diagnosis not present

## 2015-12-14 DIAGNOSIS — Y998 Other external cause status: Secondary | ICD-10-CM | POA: Insufficient documentation

## 2015-12-14 DIAGNOSIS — Y92009 Unspecified place in unspecified non-institutional (private) residence as the place of occurrence of the external cause: Secondary | ICD-10-CM | POA: Diagnosis not present

## 2015-12-14 DIAGNOSIS — Z79899 Other long term (current) drug therapy: Secondary | ICD-10-CM | POA: Insufficient documentation

## 2015-12-14 DIAGNOSIS — S99911A Unspecified injury of right ankle, initial encounter: Secondary | ICD-10-CM | POA: Diagnosis present

## 2015-12-14 DIAGNOSIS — T148XXA Other injury of unspecified body region, initial encounter: Secondary | ICD-10-CM

## 2015-12-14 DIAGNOSIS — Y9289 Other specified places as the place of occurrence of the external cause: Secondary | ICD-10-CM | POA: Insufficient documentation

## 2015-12-14 DIAGNOSIS — Y9389 Activity, other specified: Secondary | ICD-10-CM | POA: Insufficient documentation

## 2015-12-14 DIAGNOSIS — S5012XA Contusion of left forearm, initial encounter: Secondary | ICD-10-CM | POA: Diagnosis not present

## 2015-12-14 DIAGNOSIS — S199XXA Unspecified injury of neck, initial encounter: Secondary | ICD-10-CM | POA: Diagnosis not present

## 2015-12-14 DIAGNOSIS — F17210 Nicotine dependence, cigarettes, uncomplicated: Secondary | ICD-10-CM | POA: Insufficient documentation

## 2015-12-14 DIAGNOSIS — S6992XA Unspecified injury of left wrist, hand and finger(s), initial encounter: Secondary | ICD-10-CM | POA: Diagnosis present

## 2015-12-14 DIAGNOSIS — S60419A Abrasion of unspecified finger, initial encounter: Secondary | ICD-10-CM | POA: Insufficient documentation

## 2015-12-14 DIAGNOSIS — M25571 Pain in right ankle and joints of right foot: Secondary | ICD-10-CM

## 2015-12-14 DIAGNOSIS — Z9889 Other specified postprocedural states: Secondary | ICD-10-CM | POA: Diagnosis not present

## 2015-12-14 DIAGNOSIS — S20412A Abrasion of left back wall of thorax, initial encounter: Secondary | ICD-10-CM | POA: Insufficient documentation

## 2015-12-14 DIAGNOSIS — S50811A Abrasion of right forearm, initial encounter: Secondary | ICD-10-CM | POA: Diagnosis not present

## 2015-12-14 DIAGNOSIS — S7012XA Contusion of left thigh, initial encounter: Secondary | ICD-10-CM | POA: Insufficient documentation

## 2015-12-14 DIAGNOSIS — T148 Other injury of unspecified body region: Secondary | ICD-10-CM | POA: Diagnosis not present

## 2015-12-14 DIAGNOSIS — F319 Bipolar disorder, unspecified: Secondary | ICD-10-CM | POA: Diagnosis not present

## 2015-12-14 DIAGNOSIS — B20 Human immunodeficiency virus [HIV] disease: Secondary | ICD-10-CM | POA: Insufficient documentation

## 2015-12-14 DIAGNOSIS — S60519A Abrasion of unspecified hand, initial encounter: Secondary | ICD-10-CM

## 2015-12-14 MED ORDER — HYDROCODONE-ACETAMINOPHEN 5-325 MG PO TABS: 1.0000 | ORAL_TABLET | Freq: Four times a day (QID) | ORAL | Status: DC | PRN

## 2015-12-14 MED ORDER — HYDROCODONE-ACETAMINOPHEN 5-325 MG PO TABS
2.0000 | ORAL_TABLET | Freq: Once | ORAL | Status: AC
  Administered 2015-12-14: 2 via ORAL
  Filled 2015-12-14: qty 2
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
## 2015-12-14 NOTE — ED Notes (Signed)
PA at bedside.
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
## 2015-12-14 NOTE — Discharge Instructions (Signed)
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
## 2015-12-14 NOTE — Discharge Instructions (Signed)
Abrasion An abrasion is a cut or scrape on the outer surface of your skin. An abrasion does not extend through all of the layers of your skin. It is important to care for your abrasion properly to prevent infection. CAUSES Most abrasions are caused by falling on or gliding across the ground or another surface. When your skin rubs on something, the outer and inner layer of skin rubs off.  SYMPTOMS A cut or scrape is the main symptom of this condition. The scrape may be bleeding, or it may appear red or pink. If there was an associated fall, there may be an underlying bruise. DIAGNOSIS An abrasion is diagnosed with a physical exam. TREATMENT Treatment for this condition depends on how large and deep the abrasion is. Usually, your abrasion will be cleaned with water and mild soap. This removes any dirt or debris that may be stuck. An antibiotic ointment may be applied to the abrasion to help prevent infection. A bandage (dressing) may be placed on the abrasion to keep it clean. You may also need a tetanus shot. HOME CARE INSTRUCTIONS Medicines  Take or apply medicines only as directed by your health care provider.  If you were prescribed an antibiotic ointment, finish all of it even if you start to feel better. Wound Care  Clean the wound with mild soap and water 2-3 times per day or as directed by your health care provider. Pat your wound dry with a clean towel. Do not rub it.  There are many different ways to close and cover a wound. Follow instructions from your health care provider about:  Wound care.  Dressing changes and removal.  Check your wound every day for signs of infection. Watch for:  Redness, swelling, or pain.  Fluid, blood, or pus. General Instructions  Keep the dressing dry as directed by your health care provider. Do not take baths, swim, use a hot tub, or do anything that would put your wound underwater until your health care provider approves.  If there is  swelling, raise (elevate) the injured area above the level of your heart while you are sitting or lying down.  Keep all follow-up visits as directed by your health care provider. This is important. SEEK MEDICAL CARE IF:  You received a tetanus shot and you have swelling, severe pain, redness, or bleeding at the injection site.  Your pain is not controlled with medicine.  You have increased redness, swelling, or pain at the site of your wound. SEEK IMMEDIATE MEDICAL CARE IF:  You have a red streak going away from your wound.  You have a fever.  You have fluid, blood, or pus coming from your wound.  You notice a bad smell coming from your wound or your dressing.   This information is not intended to replace advice given to you by your health care provider. Make sure you discuss any questions you have with your health care provider.   Document Released: 07/31/2005 Document Revised: 07/12/2015 Document Reviewed: 10/19/2014 Elsevier Interactive Patient Education 2016 [employer].  General Assault Assault includes any behavior or physical attack--whether it is on purpose or not--that results in injury to another person, damage to property, or both. This also includes assault that has not yet happened, but is planned to happen. Threats of assault may be physical, verbal, or written. They may be said or sent by:  Mail.  E-mail.  Text.  Social media.  Fax. The threats may be direct, implied, or understood. WHAT ARE  THE DIFFERENT FORMS OF ASSAULT? Forms of assault include:  Physically assaulting a person. This includes physical threats to inflict physical harm as well as:  Slapping.  Hitting.  Poking.  Kicking.  Punching.  Pushing.  Sexually assaulting a person. Sexual assault is any sexual activity that a person is forced, threatened, or coerced to participate in. It may or may not involve physical contact with the person who is assaulting you. You are sexually  assaulted if you are forced to have sexual contact of any kind.  Damaging or destroying a person's assistive equipment, such as glasses, canes, or walkers.  Throwing or hitting objects.  Using or displaying a weapon to harm or threaten someone.  Using or displaying an object that appears to be a weapon in a threatening manner.  Using greater physical size or strength to intimidate someone.  Making intimidating or threatening gestures.  Bullying.  Hazing.  Using language that is intimidating, threatening, hostile, or abusive.  Stalking.  Restraining someone with force. WHAT SHOULD I DO IF I EXPERIENCE ASSAULT?  Report assaults, threats, and stalking to the police. Call your local emergency services (911 in the [place] if you are in immediate danger or you need medical help.  You can work with a Clinical research associate or an advocate to get legal protection against someone who has assaulted you or threatened you with assault. Protection includes restraining orders and private addresses. Crimes against you, such as assault, can also be prosecuted through the courts. Laws will vary depending on where you live.   This information is not intended to replace advice given to you by your health care provider. Make sure you discuss any questions you have with your health care provider.   Document Released: 10/21/2005 Document Revised: 11/11/2014 Document Reviewed: 07/08/2014 Elsevier Interactive Patient Education 2016 [employer]  Wound Care Taking care of your wound properly can help to prevent pain and infection. It can also help your wound to heal more quickly.  HOW TO CARE FOR YOUR WOUND  Take or apply over-the-counter and prescription medicines only as told by your health care provider.  If you were prescribed antibiotic medicine, take or apply it as told by your health care provider. Do not stop using the antibiotic even if your condition improves.  Clean the wound each day or as told by your  health care provider.  Wash the wound with mild soap and water.  Rinse the wound with water to remove all soap.  Pat the wound dry with a clean towel. Do not rub it.  There are many different ways to close and cover a wound. For example, a wound can be covered with stitches (sutures), skin glue, or adhesive strips. Follow instructions from your health care provider about:  How to take care of your wound.  When and how you should change your bandage (dressing).  When you should remove your dressing.  Removing whatever was used to close your wound.  Check your wound every day for signs of infection. Watch for:  Redness, swelling, or pain.  Fluid, blood, or pus.  Keep the dressing dry until your health care provider says it can be removed. Do not take baths, swim, use a hot tub, or do anything that would put your wound underwater until your health care provider approves.  Raise (elevate) the injured area above the level of your heart while you are sitting or lying down.  Do not scratch or pick at the wound.  Keep all follow-up visits  as told by your health care provider. This is important. SEEK MEDICAL CARE IF:  You received a tetanus shot and you have swelling, severe pain, redness, or bleeding at the injection site.  You have a fever.  Your pain is not controlled with medicine.  You have increased redness, swelling, or pain at the site of your wound.  You have fluid, blood, or pus coming from your wound.  You notice a bad smell coming from your wound or your dressing. SEEK IMMEDIATE MEDICAL CARE IF:  You have a red streak going away from your wound.   This information is not intended to replace advice given to you by your health care provider. Make sure you discuss any questions you have with your health care provider.   Document Released: 07/30/2008 Document Revised: 03/07/2015 Document Reviewed: 10/17/2014 Elsevier Interactive Patient Education [employer].

## 2015-12-14 NOTE — ED Notes (Signed)
[employer] at bedside. Patient concerned about the pain
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
## 2016-11-04 HISTORY — PX: BILATERAL CARPAL TUNNEL RELEASE: SHX6508

## 2016-11-05 ENCOUNTER — Other Ambulatory Visit (HOSPITAL_BASED_OUTPATIENT_CLINIC_OR_DEPARTMENT_OTHER): Payer: Self-pay

## 2016-11-05 ENCOUNTER — Encounter: Payer: Medicare HMO | Admitting: Podiatry

## 2016-11-05 NOTE — Progress Notes (Signed)
   Complete physical exam  Patient: Jeffrey Mora   DOB: 08/24/2001   36 y.o. Male  MRN: [id number]  Subjective:    No chief complaint on file.   Jeffrey Mora is a 36 y.o. male who presents today for a complete physical exam. She reports consuming a {diet types:17450} diet. {types:19826} She generally feels {DESC; WELL/FAIRLY WELL/POORLY:18703}. She reports sleeping {DESC; WELL/FAIRLY WELL/POORLY:18703}. She {does/does not:200015} have additional problems to discuss today.    Most recent fall risk assessment:    05/01/2022   10:42 AM  Fall Risk   Falls in the past year? 0  Number falls in past yr: 0  Injury with Fall? 0  Risk for fall due to : No Fall Risks  Follow up Falls evaluation completed     Most recent depression screenings:    05/01/2022   10:42 AM 03/22/2021   10:46 AM  PHQ 2/9 Scores  PHQ - 2 Score 0 0  PHQ- 9 Score 5     {VISON DENTAL STD PSA (Optional):27386}  {History (Optional):23778}  Patient Care Team: Jessup, Joy, NP as PCP - General (Nurse Practitioner)   Outpatient Medications Prior to Visit  Medication Sig   fluticasone (FLONASE) 50 MCG/ACT nasal spray Place 2 sprays into both nostrils in the morning and at bedtime. After 7 days, reduce to once daily.   norgestimate-ethinyl estradiol (SPRINTEC 28) 0.25-35 MG-MCG tablet Take 1 tablet by mouth daily.   Nystatin POWD Apply liberally to affected area 2 times per day   spironolactone (ALDACTONE) 100 MG tablet Take 1 tablet (100 mg total) by mouth daily.   No facility-administered medications prior to visit.    ROS        Objective:     There were no vitals taken for this visit. {Vitals History (Optional):23777}  Physical Exam   No results found for any visits on 06/06/22. {Show previous labs (optional):23779}    Assessment & Plan:    Routine Health Maintenance and Physical Exam  Immunization History  Administered Date(s) Administered   DTaP 11/07/1999, 01/03/2000,  03/13/2000, 11/27/2000, 06/12/2004   Hepatitis A 04/08/2008, 04/14/2009   Hepatitis B 08/25/1999, 10/02/1999, 03/13/2000   HiB (PRP-OMP) 11/07/1999, 01/03/2000, 03/13/2000, 11/27/2000   IPV 11/07/1999, 01/03/2000, 09/01/2000, 06/12/2004   Influenza,inj,Quad PF,6+ Mos 07/15/2014   Influenza-Unspecified 10/14/2012   MMR 09/01/2001, 06/12/2004   Meningococcal Polysaccharide 04/13/2012   Pneumococcal Conjugate-13 11/27/2000   Pneumococcal-Unspecified 03/13/2000, 05/27/2000   Tdap 04/13/2012   Varicella 09/01/2000, 04/08/2008    Health Maintenance  Topic Date Due   HIV Screening  Never done   Hepatitis C Screening  Never done   INFLUENZA VACCINE  06/04/2022   PAP-Cervical Cytology Screening  06/06/2022 (Originally 08/23/2020)   PAP SMEAR-Modifier  06/06/2022 (Originally 08/23/2020)   TETANUS/TDAP  06/06/2022 (Originally 04/13/2022)   HPV VACCINES  Discontinued   COVID-19 Vaccine  Discontinued    Discussed health benefits of physical activity, and encouraged her to engage in regular exercise appropriate for her age and condition.  Problem List Items Addressed This Visit   None Visit Diagnoses     Annual physical exam    -  Primary   Cervical cancer screening       Need for Tdap vaccination          No follow-ups on file.     Joy Jessup, NP   

## 2016-12-17 ENCOUNTER — Encounter (HOSPITAL_BASED_OUTPATIENT_CLINIC_OR_DEPARTMENT_OTHER): Payer: Self-pay

## 2016-12-17 ENCOUNTER — Emergency Department (HOSPITAL_BASED_OUTPATIENT_CLINIC_OR_DEPARTMENT_OTHER)
Admission: EM | Admit: 2016-12-17 | Discharge: 2016-12-17 | Disposition: A | Discharge status: Home / Self Care | Payer: Medicare HMO | Attending: Emergency Medicine | Admitting: Emergency Medicine

## 2016-12-17 DIAGNOSIS — L02415 Cutaneous abscess of right lower limb: Secondary | ICD-10-CM | POA: Insufficient documentation

## 2016-12-17 DIAGNOSIS — F17210 Nicotine dependence, cigarettes, uncomplicated: Secondary | ICD-10-CM | POA: Insufficient documentation

## 2016-12-17 DIAGNOSIS — L03115 Cellulitis of right lower limb: Secondary | ICD-10-CM | POA: Diagnosis not present

## 2016-12-17 DIAGNOSIS — Z79899 Other long term (current) drug therapy: Secondary | ICD-10-CM | POA: Diagnosis not present

## 2016-12-17 DIAGNOSIS — F1490 Cocaine use, unspecified, uncomplicated: Secondary | ICD-10-CM | POA: Insufficient documentation

## 2016-12-17 DIAGNOSIS — L299 Pruritus, unspecified: Secondary | ICD-10-CM | POA: Diagnosis present

## 2016-12-17 DIAGNOSIS — Z21 Asymptomatic human immunodeficiency virus [HIV] infection status: Secondary | ICD-10-CM | POA: Insufficient documentation

## 2016-12-17 DIAGNOSIS — F1290 Cannabis use, unspecified, uncomplicated: Secondary | ICD-10-CM | POA: Diagnosis not present

## 2016-12-17 MED ORDER — CLINDAMYCIN HCL 150 MG PO CAPS
300.0000 mg | ORAL_CAPSULE | Freq: Once | ORAL | Status: AC
  Administered 2016-12-17: 300 mg via ORAL
  Filled 2016-12-17: qty 2

## 2016-12-17 MED ORDER — CLINDAMYCIN HCL 300 MG PO CAPS: 300.0000 mg | ORAL_CAPSULE | Freq: Four times a day (QID) | ORAL | 0 refills | Status: AC

## 2016-12-17 MED ORDER — LIDOCAINE HCL 2 % IJ SOLN
10.0000 mL | Freq: Once | INTRAMUSCULAR | Status: AC
  Administered 2016-12-17: 200 mg
  Filled 2016-12-17: qty 20

## 2016-12-17 NOTE — ED Triage Notes (Signed)
Pt has an abscess to outside of right lower leg that is warm and red around it, worsening over the last three days

## 2016-12-17 NOTE — ED Provider Notes (Signed)
[facility] Provider Note   CSN: [id number] Arrival date & time: 12/17/16  0003     History   Chief Complaint Chief Complaint  Patient presents with  . Abscess    HPI Jeffrey Mora is a 36 y.o. male.  3 days ago, he noticed an itchy lesion on his right calf. He thought that he had been bitten by a mosquito. The itching has persisted and is now starting to get painful. Red area is expanding and there is now a tender area at the center and is worried that he has developed an abscess. He denies fever, chills, sweats. He has not seen any lymphangitic streaks. Of note, he had recently completed a course of trimethoprim-sulfamethoxazole for cellulitis of her surgical incision of his wrist.   The history is provided by the patient.  Abscess    Past Medical History:  Diagnosis Date  . Bipolar 1 disorder (HCC)   . HIV (human immunodeficiency virus infection) (HCC)   . Substance abuse     Patient Active Problem List   Diagnosis Date Noted  . Bipolar 2 disorder (HCC) 11/06/2015  . Polysubstance abuse 11/06/2015  . Drug overdose, intentional [facility](HCC)     Past Surgical History:  Procedure Laterality Date  . ANKLE SURGERY  11/2014  . BILATERAL CARPAL TUNNEL RELEASE  11/2016       Home Medications    Prior to Admission medications   Medication Sig Start Date End Date Taking? Authorizing Provider  buPROPion (WELLBUTRIN XL) 150 MG 24 hr tablet Take 150 mg by mouth daily.   Yes Historical Provider, MD  elvitegravir-cobicistat-emtricitabine-tenofovir (GENVOYA) 150-150-200-10 MG TABS tablet Take 1 tablet by mouth daily with breakfast.   Yes Historical Provider, MD  Multiple Vitamin (MULTIVITAMIN) capsule Take 1 capsule by mouth daily.   Yes Historical Provider, MD  efavirenz-emtricitabine-tenofovir (ATRIPLA) 600-200-300 MG tablet Take 1 tablet by mouth at bedtime. Patient not taking: Reported on 12/17/2016 11/08/15   Beau FannyJohn C Withrow, FNP  HYDROcodone-acetaminophen  (NORCO/VICODIN) 5-325 MG tablet Take 1 tablet by mouth every 6 (six) hours as needed. Patient not taking: Reported on 12/17/2016 12/14/15   Roxy Horsemanobert Browning, PA-C  hydrOXYzine (ATARAX/VISTARIL) 25 MG tablet Take 1 tablet (25 mg total) by mouth 3 (three) times daily as needed for anxiety (sleep). Patient not taking: Reported on 12/17/2016 11/08/15   Beau FannyJohn C Withrow, FNP  lamoTRIgine (LAMICTAL) 25 MG tablet Take 1 tablet (25 mg total) by mouth daily. Patient not taking: Reported on 12/17/2016 11/08/15   Beau FannyJohn C Withrow, FNP  lurasidone (LATUDA) 40 MG TABS tablet Take 1 tablet (40 mg total) by mouth daily with breakfast. Patient not taking: Reported on 12/17/2016 11/09/15   Beau FannyJohn C Withrow, FNP  nicotine (NICODERM CQ - DOSED IN MG/24 HOURS) 21 mg/24hr patch Place 1 patch (21 mg total) onto the skin daily. Patient not taking: Reported on 12/17/2016 11/08/15   Beau FannyJohn C Withrow, FNP  traZODone (DESYREL) 50 MG tablet Take 1 tablet (50 mg total) by mouth at bedtime as needed for sleep (May repeat x1). Patient not taking: Reported on 12/17/2016 11/08/15   Beau FannyJohn C Withrow, FNP    Family History No family history on file.  Social History Social History  Substance Use Topics  . Smoking status: Current Every Day Smoker    Packs/day: 1.00    Years: 15.00    Types: Cigarettes  . Smokeless tobacco: Never Used  . Alcohol use Yes     Comment: occasionally     Allergies  Patient has no known allergies.   Review of Systems Review of Systems  All other systems reviewed and are negative.    Physical Exam Updated Vital Signs BP 153/92 (BP Location: Left Arm)   Pulse 91   Temp 98.1 F (36.7 C) (Oral)   Resp 18   Ht 5\' 8"  (1.727 m)   Wt 175 lb (79.4 kg)   SpO2 100%   BMI 26.61 kg/m   Physical Exam 36 year old male, resting comfortably and in no acute distress. Vital signs are Significant for hypertension. Oxygen saturation is 100%, which is normal. Head is normocephalic and atraumatic. PERRLA, EOMI. Oropharynx  is clear. Neck is nontender and supple without adenopathy or JVD. Back is nontender and there is no CVA tenderness. Lungs are clear without rales, wheezes, or rhonchi. Chest is nontender. Heart has regular rate and rhythm without murmur. Abdomen is soft, flat, nontender without masses or hepatosplenomegaly and peristalsis is normoactive. Extremities have no cyanosis or edema, full range of motion is present. The lateral aspect of the right calf has an area of erythema with a central area of induration but no fluctuance. Erythematous area is 13 cm in diameter. At the very center, there is a 2 mm vesicle. No lymphangitic streaks are seen. Skin is warm and dry without rash. Neurologic: Mental status is normal, cranial nerves are intact, there are no motor or sensory deficits.  ED Treatments / Results   Procedures Procedures (including critical care time) EMERGENCY DEPARTMENT US SOFT TISSUE INTERPRETATION "Study: Limited Soft Tissue Ultrasound"  INDICATIONS: Soft tissue infection Multiple views of the body part were obtained in real-time with a multi-frequency linear probe  PERFORMED BY: Myself IMAGES ARCHIVED?: Yes SIDE:Right  BODY PART:Lower extremity INTERPRETATION:  Abcess present  INCISION AND DRAINAGE Performed by: AVWUJ,[email or web address] Consent: Verbal consent obtained. Risks and benefits: risks, benefits and alternatives were discussed Type: abscess  Body area: Right calf  Anesthesia: local infiltration  Incision was made with a scalpel.  Local anesthetic: lidocaine 2% without epinephrine  Anesthetic total: 3 ml  Complexity: complex Blunt dissection to break up loculations  Drainage: purulent  Drainage amount: small  Packing material: none  Patient tolerance: Patient tolerated the procedure well with no immediate complications.   Medications Ordered in ED Medications  clindamycin (CLEOCIN) capsule 300 mg (not administered)  lidocaine (XYLOCAINE) 2 % (with pres)  injection 200 mg (200 mg Infiltration Given by Other 12/17/16 4782)     Initial Impression / Assessment and Plan / ED Course  I have reviewed the triage vital signs and the nursing notes.  Pertinent labs & imaging results that were available during my care of the patient were reviewed by me and considered in my medical decision making (see chart for details).  Area of cellulitis of the right calf with central induration. Ultrasound shows abscess, so incision and drainages performed. Old records are reviewed confirming recent treatment for synovitis of his wrist with trimethoprim-sulfamethoxazole. He is discharged with prescription for clindamycin. Advised recheck in two days. Return precautions discussed.  Final Clinical Impressions(s) / ED Diagnoses   Final diagnoses:  Abscess of right lower leg  Cellulitis of right lower leg    New Prescriptions New Prescriptions   CLINDAMYCIN (CLEOCIN) 300 MG CAPSULE    Take 1 capsule (300 mg total) by mouth 4 (four) times daily. X 7 days     Dione Booze, MD 12/17/16 0400

## 2016-12-17 NOTE — Discharge Instructions (Signed)
Please see your physician, or return to the emergency department, in 2 days to have someone evaluate your leg. At any point, if the area of redness is spreading, or if you start running a fever, then return to the Emergency Department.

## 2016-12-17 NOTE — ED Notes (Signed)
Pt verbalizes understanding of d/c instructions and denies any further need at this time. 

## 2017-05-15 ENCOUNTER — Emergency Department
Admission: EM | Admit: 2017-05-15 | Discharge: 2017-05-15 | Disposition: A | Discharge status: Home / Self Care | Payer: Medicare (Managed Care) | Attending: Emergency Medicine | Admitting: Emergency Medicine

## 2017-05-15 DIAGNOSIS — F17200 Nicotine dependence, unspecified, uncomplicated: Secondary | ICD-10-CM | POA: Insufficient documentation

## 2017-05-15 DIAGNOSIS — H60331 Swimmer's ear, right ear: Secondary | ICD-10-CM

## 2017-05-15 MED ORDER — HYDROCODONE-ACETAMINOPHEN 5-325 MG PO TABS: 1.0000 | ORAL_TABLET | Freq: Four times a day (QID) | ORAL | 0 refills | Status: AC | PRN

## 2017-05-15 MED ORDER — AMOXICILLIN-POT CLAVULANATE 875-125 MG PO TABS
1.0000 | ORAL_TABLET | Freq: Two times a day (BID) | ORAL | 0 refills | Status: AC
Start: 2017-05-15 — End: 2017-05-25

## 2017-05-15 MED ORDER — HYDROCODONE-ACETAMINOPHEN 5-325 MG PO TABS
1.0000 | ORAL_TABLET | Freq: Once | ORAL | Status: AC
Start: 2017-05-15 — End: 2017-05-15
  Administered 2017-05-15: 03:00:00 1 via ORAL
  Filled 2017-05-15: qty 1

## 2017-05-15 MED ORDER — NEOMYCIN-POLYMYXIN-HC 3.5-10000-1 OT SUSP
3.0000 [drp] | Freq: Once | OTIC | Status: AC
Start: 2017-05-15 — End: 2017-05-15
  Administered 2017-05-15: 03:00:00 3 [drp] via OTIC
  Filled 2017-05-15: qty 10

## 2017-05-15 MED ORDER — AMOXICILLIN-POT CLAVULANATE 875-125 MG PO TABS
1.0000 | ORAL_TABLET | Freq: Once | ORAL | Status: AC
Start: 2017-05-15 — End: 2017-05-15
  Administered 2017-05-15: 03:00:00 1 via ORAL
  Filled 2017-05-15: qty 1

## 2017-05-15 MED ORDER — IBUPROFEN 600 MG PO TABS
600.0000 mg | ORAL_TABLET | Freq: Once | ORAL | Status: AC
Start: 2017-05-15 — End: 2017-05-15
  Administered 2017-05-15: 03:00:00 600 mg via ORAL
  Filled 2017-05-15: qty 1

## 2017-05-15 NOTE — Discharge Instructions (Signed)
neomycin-polymyxin-hydrocortisone (CORTISPORIN) otic suspension  5 drops to R ear 4 times daily.     Do not swim.  Occlude R ear with Vaseline coated cotton while showering to keep ear dry.     Follow up with ENT, Dr's Lonia Skinner, Orfaly or Swamy in 1-3 days.      Aleve or ibuprofen over the counter as directed for pain.     Norco for severe pain.  Do not take tylenol (acetaminophen) within 4 hours of taking this medication as it also contains tylenol.  This medication can be constipating.  Please drink plenty of water and consider taking over the counter stool softener such as Colace.  Do not drive or operate machinery for at least 6 hours from your last dose.  Please use caution.

## 2017-05-15 NOTE — ED Provider Notes (Signed)
EMERGENCY DEPARTMENT HISTORY AND PHYSICAL EXAM    Date: 05/15/2017  Patient Name: Harold Henry  Attending Physician: Ames Dura, DO  Mid-level: [place] PA-C    History of Presenting Illness    Physician/Midlevel provider first contact with patient: 05/15/17 0115         Chief Complaint   Patient presents with   . Otalgia       History Provided By: Patient     Chief Complaint: R ear pain   Onset: 2 weeks  Timing:   Location: R ear   Quality: throbbing, painful to move ear  Severity: 5  Modifying Factors: Ear drops prescribed 2 weeks ago and patient used for 2 days but then left drops in NC so did not complete course.    Additional History: Harold Henry is a 36 y.o. male with PMH of HIV with complaint intermittent pain bilateral ear pain x two weeks now is worse in R ear. No fever, nasal congestion, runny nose, cough.  Mild muffled hearing in R ear. Some drainage from R ear.    PCP: No primary care provider on file.    No current facility-administered medications for this encounter.     Current Outpatient Prescriptions:   .  BuPROPion HCl (WELLBUTRIN SR PO), Take by mouth., Disp: , Rfl:   .  Elviteg-Cobic-Emtricit-TenofAF (GENVOYA PO), Take by mouth., Disp: , Rfl:   .  amoxicillin-clavulanate (AUGMENTIN) 875-125 MG per tablet, Take 1 tablet by mouth 2 (two) times daily.for 10 days, Disp: 20 tablet, Rfl: 0  .  HYDROcodone-acetaminophen (NORCO) 5-325 MG per tablet, Take 1-2 tablets by mouth every 6 (six) hours as needed for Pain.for up to 6 doses, Disp: 6 tablet, Rfl: 0    Past Medical History     Past Medical History:   Diagnosis Date   . HIV (human immunodeficiency virus infection)      Past Surgical History:   Procedure Laterality Date   . ANKLE SURGERY     . CARPAL TUNNEL RELEASE         Family History     History reviewed. No pertinent family history.    Social History     Social History   Substance Use Topics   . Smoking status: Current Every Day Smoker   . Smokeless tobacco: Never Used   .  Alcohol use No       Allergies     No Known Allergies    Review of Systems     Review of Systems   Constitutional: Negative.  Negative for fever.   HENT: Positive for ear discharge (right) and ear pain (right). Negative for congestion and sore throat.    Eyes: Negative.  Negative for discharge and redness.   Respiratory: Negative.    Cardiovascular: Negative.    Gastrointestinal: Negative.    Musculoskeletal: Negative.    Skin: Negative.    Neurological: Negative for dizziness and headaches.         Physical Exam   BP 123/86   Pulse 84   Temp 98 F (36.7 C) (Oral)   Resp 16   Wt 86.2 kg   SpO2 99%     Physical Exam   Constitutional: He is oriented to person, place, and time and well-developed, well-nourished, and in no distress.   Well appearing.   HENT:   Head: Normocephalic and atraumatic.   Right Ear: There is drainage, swelling and tenderness.   Left Ear: Tympanic membrane and external ear normal.  No tenderness.   Nose: Nose normal.   Mouth/Throat: Oropharynx is clear and moist.   R canal swelling with white drainage. Canal patent. Unable to visualize TM secondary to pus in canal.   Eyes: Conjunctivae are normal. Pupils are equal, round, and reactive to light.   Neck: Normal range of motion. Neck supple.   Cardiovascular: Normal rate, regular rhythm and normal heart sounds.    Pulmonary/Chest: Effort normal and breath sounds normal.   Lymphadenopathy:     He has no cervical adenopathy.   Neurological: He is alert and oriented to person, place, and time.   Skin: Skin is warm and dry.   Psychiatric: Mood, memory, affect and judgment normal.   Nursing note and vitals reviewed.      Diagnostic Study Results     Labs -     Results     ** No results found for the last 24 hours. **          Radiologic Studies -   Radiology Results (24 Hour)     ** No results found for the last 24 hours. **      .    Clinical Course in the Emergency Department     Consultations:      Re-evaluations:      Procedures:      Medical  Decision Making   I am the first provider for this patient.    I reviewed the vital signs, available nursing notes, past medical history, past surgical history, family history and social history.    Vital Signs-Reviewed the patient's vital signs.   Patient Vitals for the past 12 hrs:   BP Temp Pulse Resp   05/15/17 0105 123/86 98 F (36.7 C) 84 16     Pulse Oximetry Interpretation: Normal 99% on RA      Provider's Notes:    VPMP queried. No prior Rx.     [place] [employer] [place] Opioid Prescribing Decision Support    I am not prescribing opioids at a quantity of > 50 MME/day.     I am not co-prescribing naloxone based on the [place] regulations governing opioid prescriptions.    I have selected an appropriate agent, the lowest dose, and the shortest duration of therapy possible based on this patient's condition. I am recommending the patient discontinue opioids after completion of this prescription unless further opioids are deemed necessary and prescribed by another licensed medical provider.    When is Querying the PMP Required for Opioid Prescribing?   Opioid prescribing > 7 days for acute pain   Opioid use for pain lasting > 3 months   [place] PMP Login     Pt counseled on diagnosis, treatment plan, f/u plans, and signs and symptoms when to return to ED. Pt is stable and ready for discharge.    ENT referral given.    Diagnosis and Treatment Plan       Clinical Impression:   1. Acute swimmer's ear of right side           Ree Edman, [place]  05/16/17 0439       Ames Dura, DO  05/23/17 [phone]

## 2017-05-15 NOTE — ED Triage Notes (Signed)
Intermittent pain R ear x two weeks, now worse. No analgesics.

## 2017-05-16 ENCOUNTER — Encounter: Payer: Self-pay | Admitting: Physician Assistant

## 2017-07-08 IMAGING — CR DG CHEST 2V
2 series · 2 of 2 positions shown · non-contrast
Comparison: None.

CLINICAL DATA: 34-year-old male with trauma and chest pain.

EXAM:
CHEST  2 VIEW

[w chest lat]
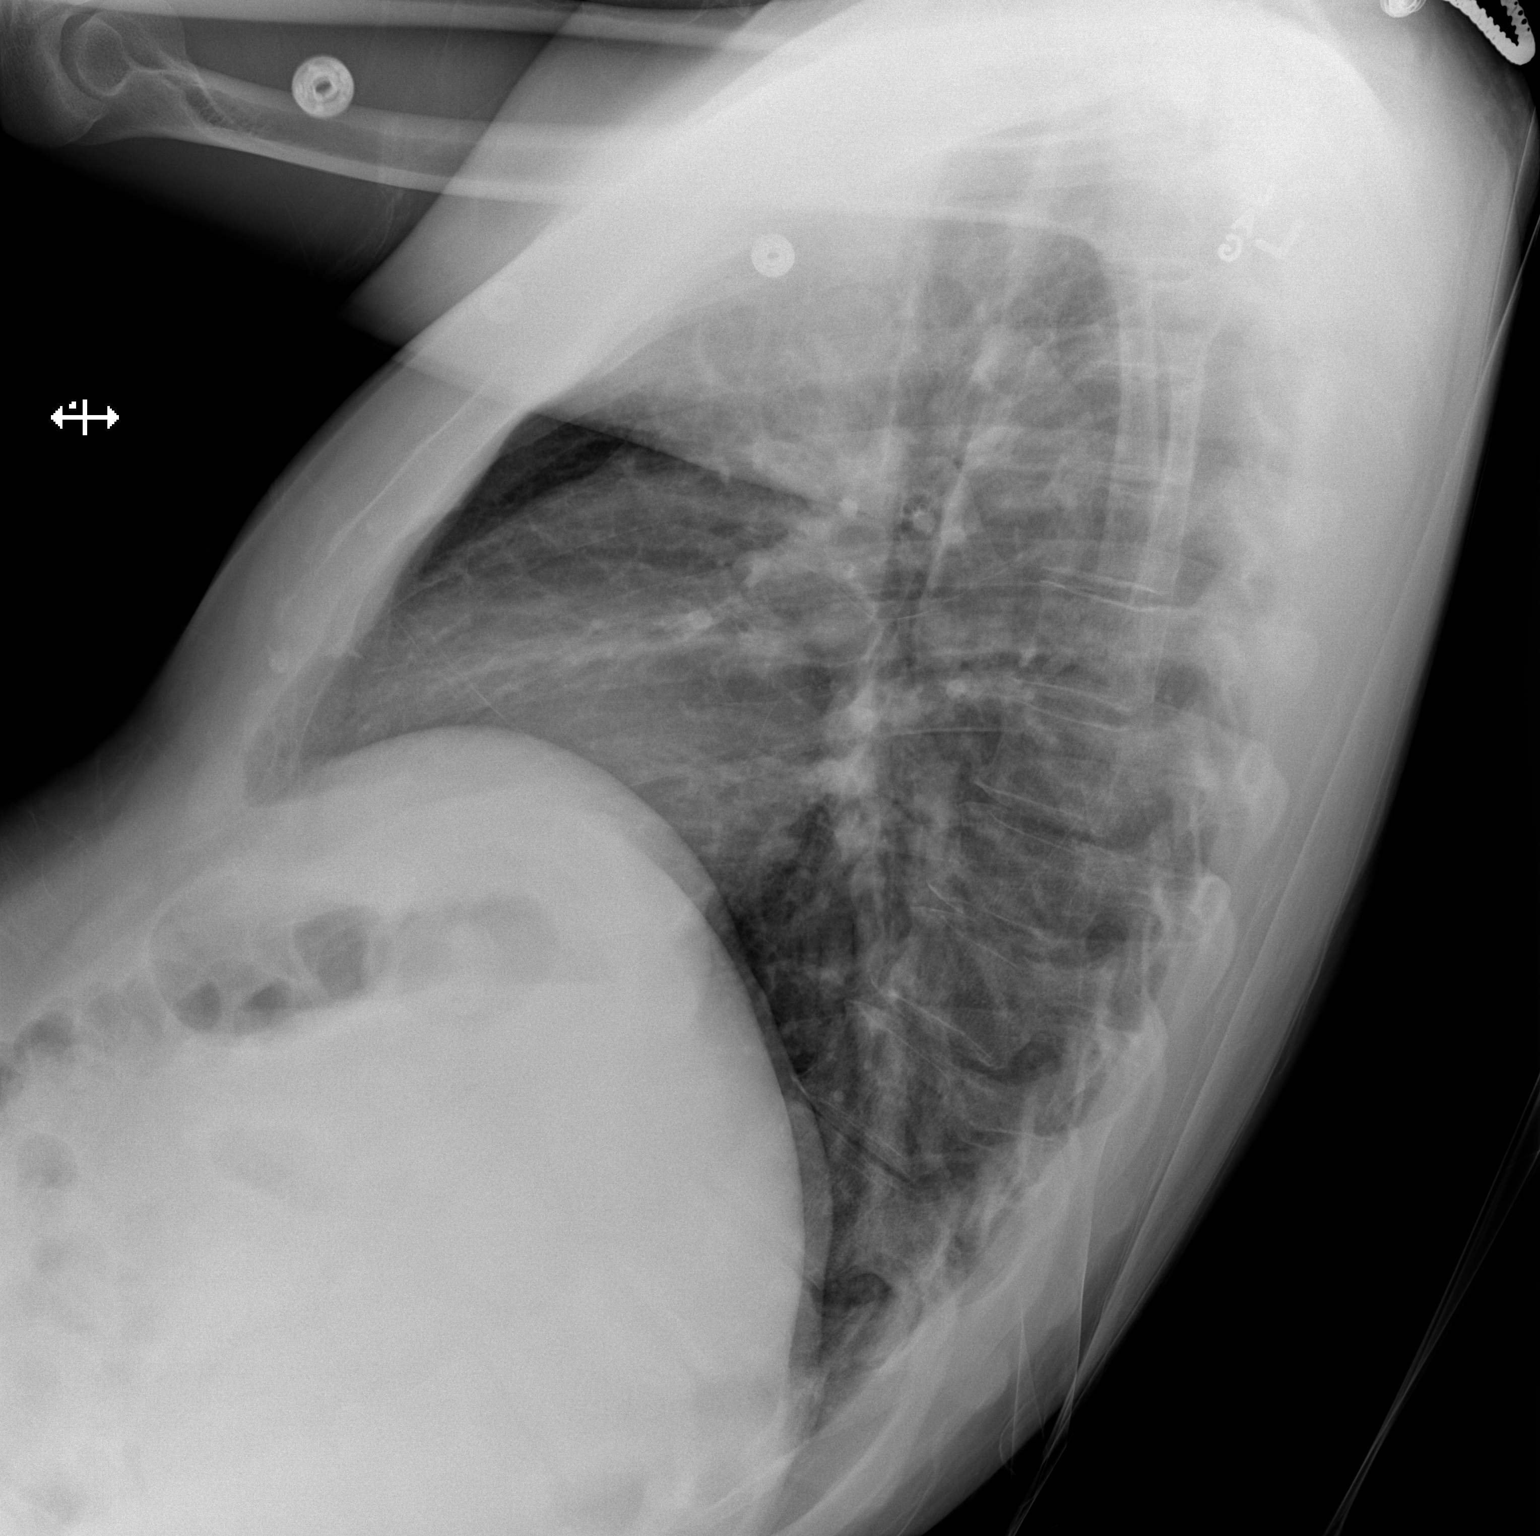

[x chest ap]
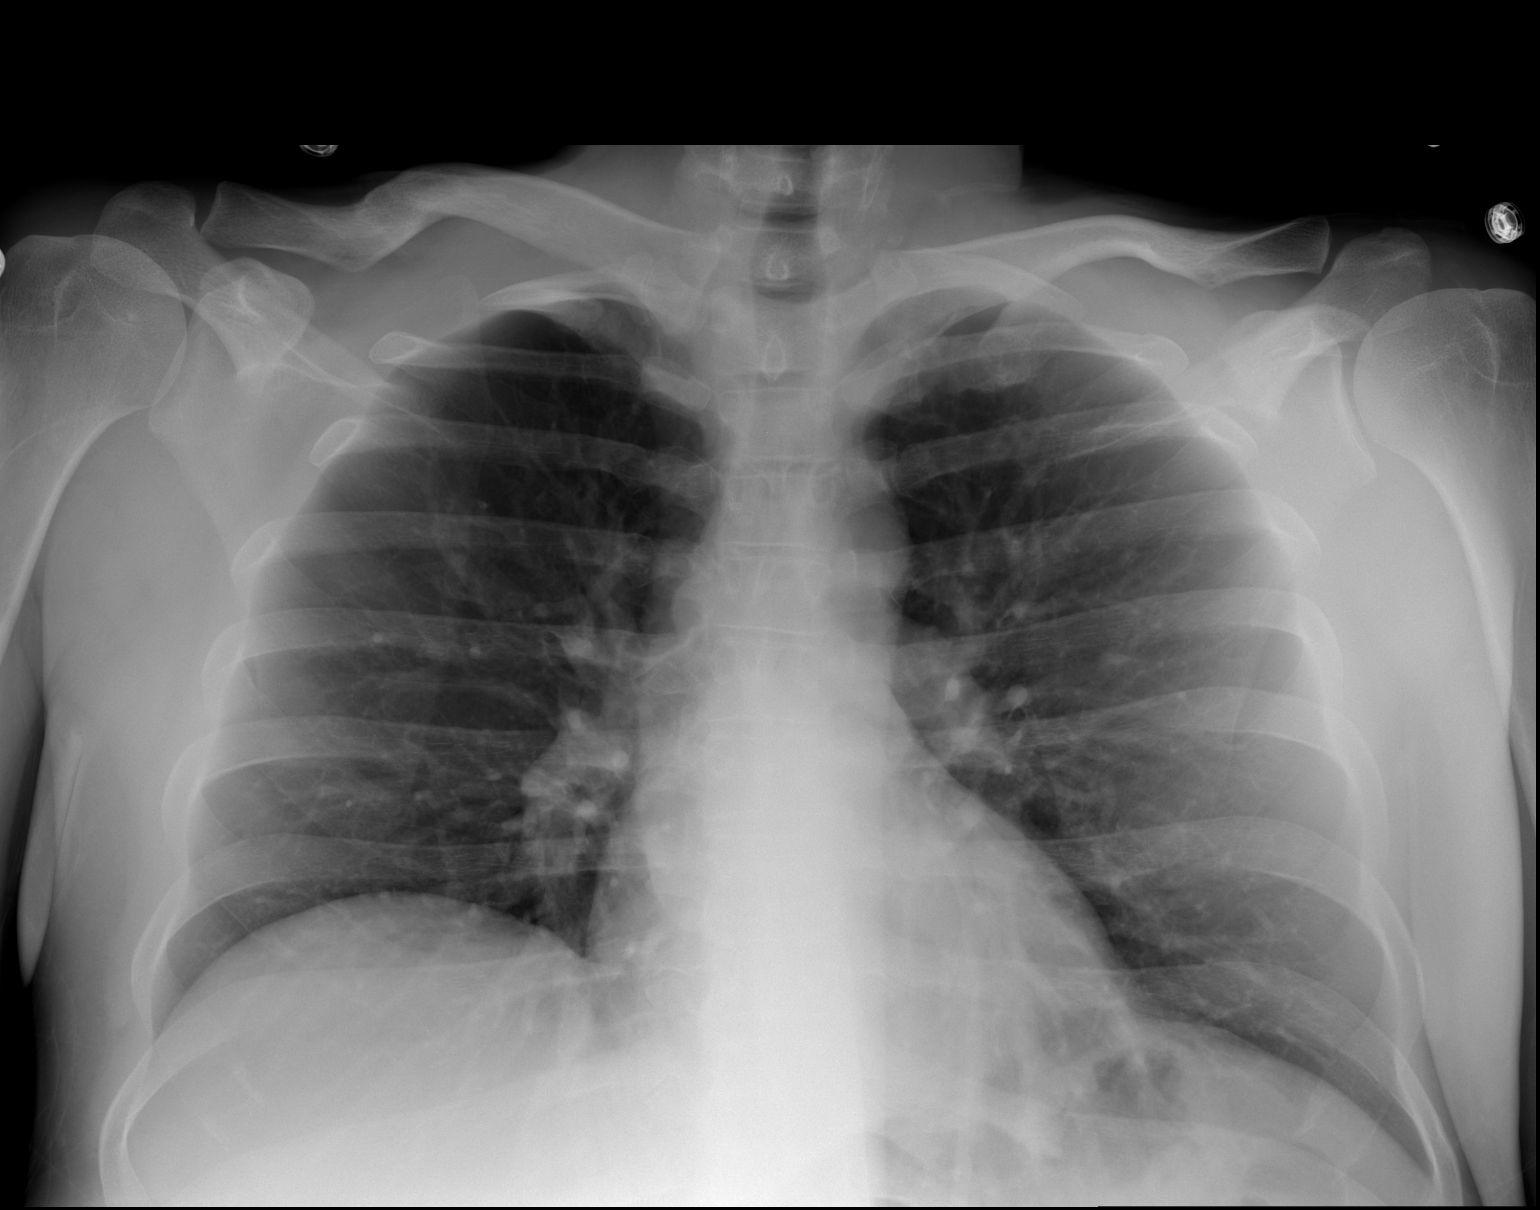

[2 of 2 positions shown; findings below may reference images not displayed]

FINDINGS: The heart size and mediastinal contours are within normal limits.
Both lungs are clear. The visualized skeletal structures are
unremarkable.
IMPRESSION: No active cardiopulmonary disease.

## 2018-07-13 ENCOUNTER — Encounter (INDEPENDENT_AMBULATORY_CARE_PROVIDER_SITE_OTHER): Payer: Self-pay

## 2018-08-25 ENCOUNTER — Encounter (INDEPENDENT_AMBULATORY_CARE_PROVIDER_SITE_OTHER): Payer: Self-pay

## 2018-09-16 ENCOUNTER — Encounter (INDEPENDENT_AMBULATORY_CARE_PROVIDER_SITE_OTHER): Payer: Self-pay

## 2018-09-23 ENCOUNTER — Encounter (INDEPENDENT_AMBULATORY_CARE_PROVIDER_SITE_OTHER): Payer: Self-pay

## 2018-09-30 DIAGNOSIS — R509 Fever, unspecified: Secondary | ICD-10-CM | POA: Insufficient documentation

## 2018-09-30 DIAGNOSIS — R05 Cough: Secondary | ICD-10-CM | POA: Insufficient documentation

## 2018-09-30 LAB — POCT RAPID STREP A: Rapid Strep A Screen POCT: NEGATIVE

## 2018-09-30 NOTE — ED Triage Notes (Addendum)
Harold Henry is a 37 y.o. male c/o fever, sore throat, cough, body aches and rash throughout his body since today. Denies any medication PTA. NAD in triage. BP 139/73   Pulse 97   Temp (!) 100.6 F (38.1 C) (Oral)   Resp 18   Ht 6' (1.829 m)   Wt 90.7 kg   SpO2 99%   BMI 27.12 kg/m

## 2018-10-01 ENCOUNTER — Emergency Department
Admission: EM | Admit: 2018-10-01 | Discharge: 2018-10-01 | Disposition: A | Discharge status: Home / Self Care | Payer: Medicare (Managed Care) | Attending: Emergency Medicine | Admitting: Emergency Medicine

## 2018-10-01 ENCOUNTER — Emergency Department: Payer: Medicare (Managed Care)

## 2018-10-01 DIAGNOSIS — R509 Fever, unspecified: Secondary | ICD-10-CM

## 2018-10-01 DIAGNOSIS — R059 Cough, unspecified: Secondary | ICD-10-CM

## 2018-10-01 LAB — GFR: EGFR: >60

## 2018-10-01 LAB — SYPHILIS SCREEN IGG AND IGM: Syphilis Screen IgG and IgM: NONREACTIVE

## 2018-10-01 LAB — CBC AND DIFFERENTIAL
Absolute NRBC: 0 10*3/uL (ref 0.00–0.00)
Basophils Absolute Automated: 0.04 10*3/uL (ref 0.00–0.08)
Basophils Automated: 0.6 %
Eosinophils Absolute Automated: 0.1 10*3/uL (ref 0.00–0.44)
Eosinophils Automated: 1.5 %
Hematocrit: 42.9 % (ref 37.6–49.6)
Hgb: 14 g/dL (ref 12.5–17.1)
Immature Granulocytes Absolute: 0.03 10*3/uL (ref 0.00–0.07)
Immature Granulocytes: 0.4 %
Lymphocytes Absolute Automated: 1.61 10*3/uL (ref 0.42–3.22)
Lymphocytes Automated: 23.4 %
MCH: 32.3 pg (ref 25.1–33.5)
MCHC: 32.6 g/dL (ref 31.5–35.8)
MCV: 98.8 fL — ABNORMAL HIGH (ref 78.0–96.0)
MPV: 9 fL (ref 8.9–12.5)
Monocytes Absolute Automated: 1.1 10*3/uL — ABNORMAL HIGH (ref 0.21–0.85)
Monocytes: 16 %
Neutrophils Absolute: 3.99 10*3/uL (ref 1.10–6.33)
Neutrophils: 58.1 %
Nucleated RBC: 0 /100 WBC (ref 0.0–0.0)
Platelets: 283 10*3/uL (ref 142–346)
RBC: 4.34 10*6/uL (ref 4.20–5.90)
RDW: 13 % (ref 11–15)
WBC: 6.87 10*3/uL (ref 3.10–9.50)

## 2018-10-01 LAB — COMPREHENSIVE METABOLIC PANEL
ALT: 23 U/L (ref 0–55)
AST (SGOT): 23 U/L (ref 5–34)
Albumin/Globulin Ratio: 1.3 (ref 0.9–2.2)
Albumin: 3.8 g/dL (ref 3.5–5.0)
Alkaline Phosphatase: 81 U/L (ref 38–106)
Anion Gap: 10 (ref 5.0–15.0)
BUN: 8 mg/dL — ABNORMAL LOW (ref 9.0–28.0)
Bilirubin, Total: 0.3 mg/dL (ref 0.2–1.2)
CO2: 25 mEq/L (ref 22–29)
Calcium: 9.3 mg/dL (ref 8.5–10.5)
Chloride: 106 mEq/L (ref 100–111)
Creatinine: 0.8 mg/dL (ref 0.7–1.3)
Globulin: 3 g/dL (ref 2.0–3.6)
Glucose: 84 mg/dL (ref 70–100)
Potassium: 4.1 mEq/L (ref 3.5–5.1)
Protein, Total: 6.8 g/dL (ref 6.0–8.3)
Sodium: 141 mEq/L (ref 136–145)

## 2018-10-01 LAB — HEMOLYSIS INDEX: Hemolysis Index: 11 (ref 0–18)

## 2018-10-01 MED ORDER — BENZONATATE 100 MG PO CAPS
100.0000 mg | ORAL_CAPSULE | Freq: Three times a day (TID) | ORAL | 0 refills | Status: AC | PRN
Start: 2018-10-01 — End: ?

## 2018-10-01 MED ORDER — NAPROXEN 500 MG PO TABS
500.0000 mg | ORAL_TABLET | Freq: Two times a day (BID) | ORAL | 0 refills | Status: AC
Start: 2018-10-01 — End: ?

## 2018-10-01 MED ORDER — SODIUM CHLORIDE 0.9 % IV BOLUS
1000.0000 mL | Freq: Once | INTRAVENOUS | Status: AC
Start: 2018-10-01 — End: 2018-10-01
  Administered 2018-10-01: 02:00:00 1000 mL via INTRAVENOUS

## 2018-10-01 MED ORDER — KETOROLAC TROMETHAMINE 30 MG/ML IJ SOLN
30.0000 mg | Freq: Once | INTRAMUSCULAR | Status: AC
Start: 2018-10-01 — End: 2018-10-01
  Administered 2018-10-01: 02:00:00 30 mg via INTRAVENOUS
  Filled 2018-10-01: qty 1

## 2018-10-01 NOTE — ED Provider Notes (Signed)
Physician/Midlevel provider first contact with patient: 10/01/18 0049         Pt presents with fever and chills intermittently today.  Pt has had a scratchy throat and some cough as well.  Pt states that he has intermittently seen some rashes on his body over the past 3 weeks which have been coming and going.  Pt states that he did use IV drugs a month ago.  Pt also reports multiple sexual partners.  Pt states that his cell counts have been fine.  Hx from the pt.      Review of Systems   Constitutional: Positive for chills and fever.   HENT: Positive for sore throat.    Respiratory: Positive for cough. Negative for wheezing.    Cardiovascular: Negative.      Physical Exam  Vitals signs and nursing note reviewed.   Constitutional:       Appearance: Normal appearance.   HENT:      Head: Normocephalic and atraumatic.   Eyes:      General: No scleral icterus.     Extraocular Movements: Extraocular movements intact.   Neck:      Musculoskeletal: Normal range of motion and neck supple.   Cardiovascular:      Rate and Rhythm: Normal rate and regular rhythm.      Heart sounds: No murmur.   Pulmonary:      Effort: Pulmonary effort is normal.      Breath sounds: Normal breath sounds.   Abdominal:      General: There is no distension.      Tenderness: There is no tenderness.   Musculoskeletal: Normal range of motion.      Right lower leg: No edema.      Left lower leg: No edema.   Skin:     General: Skin is warm and dry.      Findings: No rash.   Neurological:      General: No focal deficit present.      Mental Status: He is alert and oriented to person, place, and time.     Oxygen saturations are 99%    4:44 AM  I discussed the results with the pt and explained the treatment plan.    Pt comfortable.  Will d/c home.      Informed pt that RPR pending still.    Impression:  1. Cough    2. Fever in adult           Jethro Bastos, MD  10/01/18 [phone]

## 2018-10-01 NOTE — Discharge Instructions (Signed)
Cough     You have been seen for your cough.     There are many possible causes of cough. Most are not dangerous. Your doctor has determined that it is OK for you to go home today.     Antibiotics are not necessary for your cough at this time. If your cough was caused by a virus, asthma, or lung irritation, then antibiotics will not help.     The doctor may have prescribed some medicine to help with your cough. Use the medicine as directed.     YOU SHOULD SEEK MEDICAL ATTENTION IMMEDIATELY, EITHER HERE OR AT THE NEAREST EMERGENCY DEPARTMENT, IF ANY OF THE FOLLOWING OCCURS:  · You wheeze or have trouble breathing.  · You cough up mucous or lose weight for no reason.  · You have a fever (temperature higher than 100.4ºF / 38ºC) that lasts more than 5 days.  · You have chest pain.  · Your symptoms get worse or do not get better in 2 or 3 days.  · You have any new problems or concerns.

## 2018-10-04 ENCOUNTER — Emergency Department
Admission: EM | Admit: 2018-10-04 | Discharge: 2018-10-04 | Disposition: A | Discharge status: Home / Self Care | Payer: Medicare (Managed Care) | Attending: Emergency Medicine | Admitting: Emergency Medicine

## 2018-10-04 DIAGNOSIS — Z21 Asymptomatic human immunodeficiency virus [HIV] infection status: Secondary | ICD-10-CM | POA: Insufficient documentation

## 2018-10-04 DIAGNOSIS — J029 Acute pharyngitis, unspecified: Secondary | ICD-10-CM | POA: Insufficient documentation

## 2018-10-04 LAB — POCT RAPID STREP A: Rapid Strep A Screen POCT: NEGATIVE

## 2018-10-04 MED ORDER — LIDOCAINE VISCOUS HCL 2 % MT SOLN
10.0000 mL | Freq: Once | OROMUCOSAL | Status: AC
Start: 2018-10-04 — End: 2018-10-04
  Administered 2018-10-04: 21:00:00 10 mL via OROMUCOSAL
  Filled 2018-10-04: qty 15

## 2018-10-04 NOTE — Discharge Instructions (Signed)
Take tylenol 650 mg every 6 hours as needed for pain. You may also take ibuprofen 600 mg every 6-8 hours. You may also try over the counter remedies such as Chloraceptic throat spray, or Cepacol throat lozenges. Warm tea with honey can also soothe the throat. Be sure to drink plenty of fluids.       Pharyngitis, Viral    You have been diagnosed with viral pharyngitis. This is the medical term for a sore throat.    Viral pharyngitis is an infection in the back of your throat and tonsils caused by a virus. It may look and feel like a Strep throat, but it usually occurs with other cold symptoms like a runny nose, sinus congestion, cough, fever (temperature higher than 100.4F / 38C), or muscle aches. It is sometimes hard to tell a viral sore throat from Strep throat. Your doctor may do a "rapid strep test" or culture to see which kind of infection you have.    Symptoms include fever, sore throat, painful swallowing, and headache. You may also have symptoms of the common cold and notice swollen tender neck glands. You might have pus or white spots on your tonsils.    Viral pharyngitis is treated with medication for pain and fever. Antibiotics DO NOT cure viral infections and may cause side-effects, like diarrhea, nausea, abdominal cramps or allergic reactions. Using antibiotics when they are not necessary can lead to resistance, meaning antibiotics will not work when you need them in the future.    YOU SHOULD SEEK MEDICAL ATTENTION IMMEDIATELY, EITHER HERE OR AT THE NEAREST EMERGENCY DEPARTMENT, IF ANY OF THE FOLLOWING OCCURS:   You have trouble breathing.   Your voice changes or sounds hoarse.   Your throat pain gets worse or you have neck pain.   You cannot swallow liquids or medication.   You drool or cannot swallow saliva.   You feel worse or don't improve after 2 to 3 days.

## 2018-10-04 NOTE — ED Provider Notes (Signed)
EMERGENCY DEPARTMENT HISTORY AND PHYSICAL EXAM     Physician/Midlevel provider first contact with patient: 10/04/18 1928         Date: 10/04/2018  Patient Name: Harold Henry    History of Presenting Illness     Chief Complaint   Patient presents with   . Sore Throat       History Provided By: Patient    Chief Complaint: Sore throat  Onset: 4 days  Timing: constant  Location: throat  Quality: scratchy  Severity:   Modifying Factors: improves when eating and drinking.  Associated Symptoms: cough    Additional History: Harold Henry is a 37 y.o. male with h/o HIV who presents with complaint of sore throat x 4 days. Patient was seen in ED 4 days ago when symptoms began and tested negative for strep and flu. Presents today requesting antibiotics given he has persisting sx. Has been taking naprosyn, ibuprofen, dayquil and nyquil compliant on antiretrovirals. States cell cell counts are WNL.     PCP: Pcp, [place], MD      No current facility-administered medications for this encounter.      Current Outpatient Medications   Medication Sig Dispense Refill   . benzonatate (TESSALON) 100 MG capsule Take 1 capsule (100 mg total) by mouth 3 (three) times daily as needed for Cough 15 capsule 0   . ibuprofen (ADVIL,MOTRIN) 800 MG tablet Take 800 mg by mouth every 6 (six) hours as needed for Pain     . naproxen (NAPROSYN) 500 MG tablet Take 1 tablet (500 mg total) by mouth 2 (two) times daily with meals 14 tablet 0   . BuPROPion HCl (WELLBUTRIN SR PO) Take by mouth.     . Elviteg-Cobic-Emtricit-TenofAF (GENVOYA PO) Take by mouth.         Past History     Past Medical History:  Past Medical History:   Diagnosis Date   . HIV (human immunodeficiency virus infection)        Past Surgical History:  Past Surgical History:   Procedure Laterality Date   . ANKLE SURGERY     . CARPAL TUNNEL RELEASE         Family History:  No family history on file.    Social History:  Social History     Tobacco Use   . Smoking status: Former Games developer    . Smokeless tobacco: Current User     Types: Snuff   Substance Use Topics   . Alcohol use: No   . Drug use: No       Allergies:  No Known Allergies    Review of Systems     Review of Systems   Constitutional: Positive for chills. Negative for fever.   HENT: Positive for sore throat. Negative for congestion and ear pain.    Respiratory: Positive for cough. Negative for sputum production and shortness of breath.    Gastrointestinal: Negative for nausea and vomiting.   Endo/Heme/Allergies:        NKA         Physical Exam   BP 131/87   Pulse 88   Temp 98.2 F (36.8 C) (Oral)   Resp 18   Ht 6' (1.829 m)   Wt 90.7 kg   SpO2 99%   BMI 27.12 kg/m     Physical Exam  Vitals signs and nursing note reviewed.   Constitutional:       General: He is not in acute distress.  Appearance: He is well-developed. He is not toxic-appearing or diaphoretic.   HENT:      Head: Normocephalic and atraumatic.      Jaw: No trismus.      Right Ear: Tympanic membrane, ear canal and external ear normal.      Left Ear: Tympanic membrane, ear canal and external ear normal.      Nose: No mucosal edema or rhinorrhea.      Mouth/Throat:      Mouth: Mucous membranes are moist.      Pharynx: Uvula midline. Posterior oropharyngeal erythema present. No oropharyngeal exudate or uvula swelling.      Tonsils: No tonsillar abscesses.   Eyes:      General: No scleral icterus.     Conjunctiva/sclera: Conjunctivae normal.      Right eye: Right conjunctiva is not injected.      Left eye: Left conjunctiva is not injected.      Pupils: Pupils are equal, round, and reactive to light.   Neck:      Musculoskeletal: Neck supple.      Trachea: Phonation normal.   Cardiovascular:      Rate and Rhythm: Normal rate and regular rhythm.      Heart sounds: Normal heart sounds, S1 normal and S2 normal.   Pulmonary:      Effort: Pulmonary effort is normal.      Breath sounds: Normal breath sounds. No wheezing, rhonchi or rales.   Lymphadenopathy:      Cervical: No  cervical adenopathy.   Skin:     General: Skin is warm and dry.   Neurological:      Mental Status: He is alert and oriented to person, place, and time.      GCS: GCS eye subscore is 4. GCS verbal subscore is 5. GCS motor subscore is 6.           Diagnostic Study Results     Labs -     Results     Procedure Component Value Units Date/Time    Rapid Strep POCT [[id number]] Collected:  10/04/18 1958    Specimen:  Throat Updated:  10/04/18 2004     POCT QC Pass     Rapid Strep A Screen POCT Negative      Comment Negative Results should be confirmed by throat Cx to confirm absence of Strep A inf.    Throat Culture [[id number]] Collected:  10/04/18 2001    Specimen:  Throat Updated:  10/04/18 2001          Radiologic Studies -   Radiology Results (24 Hour)     ** No results found for the last 24 hours. **      .      Medical Decision Making   I am the first provider for this patient.    I reviewed the vital signs, available nursing notes, past medical history, past surgical history, family history and social history.    Vital Signs-Reviewed the patient's vital signs.     Patient Vitals for the past 12 hrs:   BP Temp Pulse Resp   10/04/18 1900 131/87 98.2 F (36.8 C) 88 18       Pulse Oximetry Analysis - Normal 99% on RA    Old Medical Records: Old medical records.  Nursing notes.     ED Course:     Provider Notes: 37 y/o M with sore throat x 4 days. Seen in ED on 11/28 for the same. Negative rapid  strep. Throat cx sent. No fevers, non-toxic appearing, lungs CTA, no difficulty tolerating secretions, no evidence of PTA. Supportive care, f/u with PCP. Return precautions discussed.     Procedures:    Core Measures:    Critical Care Time:     Diagnosis     Clinical Impression:   1. Pharyngitis, unspecified etiology        _______________________________               Clarisa Schools, PA  10/04/18 2144       Cherlyn Roberts, MD  10/05/18 1309

## 2018-10-04 NOTE — ED Triage Notes (Signed)
Harold Henry is a 37 y.o. male presents to the ED for c/o sore throat. Pt. Reports noticing white coating on his tonsils today. Pt. Reports taking ibuprofen 800 mg @ 1700 aprox. Pt. Denies any vomiting and reports "red hot needles" in the back of his throat when he coughs.   BP 131/87   Pulse 88   Temp 98.2 F (36.8 C) (Oral)   Resp 18   Ht 6' (1.829 m)   Wt 90.7 kg   SpO2 99%   BMI 27.12 kg/m

## 2018-10-06 MED ORDER — AMOXICILLIN 500 MG PO CAPS
500.0000 mg | ORAL_CAPSULE | Freq: Two times a day (BID) | ORAL | 0 refills | Status: AC
Start: 2018-10-06 — End: 2018-10-16

## 2018-10-06 NOTE — ED Notes (Signed)
Patient called. Results given. Pharmacy verified. No other complaints expressed by patient.

## 2018-10-06 NOTE — Progress Notes (Signed)
Attempted call. Left VM. abx sent to listed pharmacy. + throat culture. Start Amoxicillin

## 2018-10-12 ENCOUNTER — Encounter (INDEPENDENT_AMBULATORY_CARE_PROVIDER_SITE_OTHER): Payer: Self-pay

## 2018-10-21 ENCOUNTER — Encounter (INDEPENDENT_AMBULATORY_CARE_PROVIDER_SITE_OTHER): Payer: Self-pay

## 2018-11-11 ENCOUNTER — Encounter (INDEPENDENT_AMBULATORY_CARE_PROVIDER_SITE_OTHER): Payer: Self-pay

## 2018-11-12 ENCOUNTER — Encounter (INDEPENDENT_AMBULATORY_CARE_PROVIDER_SITE_OTHER): Payer: Self-pay

## 2018-12-16 ENCOUNTER — Encounter (INDEPENDENT_AMBULATORY_CARE_PROVIDER_SITE_OTHER): Payer: Self-pay

## 2019-01-05 ENCOUNTER — Encounter (INDEPENDENT_AMBULATORY_CARE_PROVIDER_SITE_OTHER): Payer: Self-pay

## 2019-01-10 ENCOUNTER — Encounter (INDEPENDENT_AMBULATORY_CARE_PROVIDER_SITE_OTHER): Payer: Self-pay

## 2019-01-11 ENCOUNTER — Encounter (INDEPENDENT_AMBULATORY_CARE_PROVIDER_SITE_OTHER): Payer: Self-pay

## 2019-01-26 ENCOUNTER — Encounter (INDEPENDENT_AMBULATORY_CARE_PROVIDER_SITE_OTHER): Payer: Self-pay

## 2019-02-10 ENCOUNTER — Encounter (INDEPENDENT_AMBULATORY_CARE_PROVIDER_SITE_OTHER): Payer: Self-pay

## 2019-03-12 ENCOUNTER — Encounter (INDEPENDENT_AMBULATORY_CARE_PROVIDER_SITE_OTHER): Payer: Self-pay

## 2019-04-12 ENCOUNTER — Encounter (INDEPENDENT_AMBULATORY_CARE_PROVIDER_SITE_OTHER): Payer: Self-pay

## 2019-05-12 ENCOUNTER — Encounter (INDEPENDENT_AMBULATORY_CARE_PROVIDER_SITE_OTHER): Payer: Self-pay

## 2019-05-13 ENCOUNTER — Encounter (INDEPENDENT_AMBULATORY_CARE_PROVIDER_SITE_OTHER): Payer: Self-pay

## 2019-06-12 ENCOUNTER — Encounter (INDEPENDENT_AMBULATORY_CARE_PROVIDER_SITE_OTHER): Payer: Self-pay

## 2019-06-13 ENCOUNTER — Encounter (INDEPENDENT_AMBULATORY_CARE_PROVIDER_SITE_OTHER): Payer: Self-pay

## 2019-07-13 ENCOUNTER — Encounter (INDEPENDENT_AMBULATORY_CARE_PROVIDER_SITE_OTHER): Payer: Self-pay

## 2019-08-13 ENCOUNTER — Encounter (INDEPENDENT_AMBULATORY_CARE_PROVIDER_SITE_OTHER): Payer: Self-pay

## 2019-09-12 ENCOUNTER — Encounter (INDEPENDENT_AMBULATORY_CARE_PROVIDER_SITE_OTHER): Payer: Self-pay

## 2019-09-13 ENCOUNTER — Encounter (INDEPENDENT_AMBULATORY_CARE_PROVIDER_SITE_OTHER): Payer: Self-pay

## 2019-09-28 ENCOUNTER — Encounter (INDEPENDENT_AMBULATORY_CARE_PROVIDER_SITE_OTHER): Payer: Self-pay

## 2019-10-12 ENCOUNTER — Encounter (INDEPENDENT_AMBULATORY_CARE_PROVIDER_SITE_OTHER): Payer: Self-pay

## 2019-10-13 ENCOUNTER — Encounter (INDEPENDENT_AMBULATORY_CARE_PROVIDER_SITE_OTHER): Payer: Self-pay

## 2019-11-12 ENCOUNTER — Encounter (INDEPENDENT_AMBULATORY_CARE_PROVIDER_SITE_OTHER): Payer: Self-pay

## 2019-11-13 ENCOUNTER — Encounter (INDEPENDENT_AMBULATORY_CARE_PROVIDER_SITE_OTHER): Payer: Self-pay

## 2019-12-13 ENCOUNTER — Encounter (INDEPENDENT_AMBULATORY_CARE_PROVIDER_SITE_OTHER): Payer: Self-pay

## 2020-01-10 ENCOUNTER — Encounter (INDEPENDENT_AMBULATORY_CARE_PROVIDER_SITE_OTHER): Payer: Self-pay
